# Patient Record
Sex: Male | Born: 1990 | Race: White | Hispanic: No | Marital: Married | Smoking: Never smoker
Health system: Southern US, Community
[De-identification: ages and names within clinical notes are randomized; demographics above are authoritative.]

## PROBLEM LIST (undated history)

## (undated) DIAGNOSIS — K219 Gastro-esophageal reflux disease without esophagitis: Secondary | ICD-10-CM

## (undated) DIAGNOSIS — U071 COVID-19: Secondary | ICD-10-CM

## (undated) DIAGNOSIS — R0602 Shortness of breath: Secondary | ICD-10-CM

## (undated) DIAGNOSIS — M545 Low back pain, unspecified: Secondary | ICD-10-CM

## (undated) HISTORY — DX: COVID-19: U07.1

## (undated) HISTORY — PX: SHOULDER SURGERY: SHX246

## (undated) HISTORY — DX: Low back pain, unspecified: M54.50

## (undated) HISTORY — DX: Shortness of breath: R06.02

## (undated) HISTORY — DX: Gastro-esophageal reflux disease without esophagitis: K21.9

---
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
## 2008-01-22 ENCOUNTER — Ambulatory Visit: Payer: Self-pay | Admitting: Family Medicine

## 2008-01-22 DIAGNOSIS — Q828 Other specified congenital malformations of skin: Secondary | ICD-10-CM

## 2008-01-28 ENCOUNTER — Ambulatory Visit: Payer: Self-pay | Admitting: Family Medicine

## 2008-01-31 ENCOUNTER — Telehealth (INDEPENDENT_AMBULATORY_CARE_PROVIDER_SITE_OTHER): Payer: Self-pay | Admitting: Family Medicine

## 2008-02-09 ENCOUNTER — Ambulatory Visit: Payer: Self-pay | Admitting: Family Medicine

## 2008-02-09 DIAGNOSIS — M25539 Pain in unspecified wrist: Secondary | ICD-10-CM

## 2008-07-23 ENCOUNTER — Encounter (INDEPENDENT_AMBULATORY_CARE_PROVIDER_SITE_OTHER): Payer: Self-pay | Admitting: Family Medicine

## 2008-08-24 ENCOUNTER — Encounter (INDEPENDENT_AMBULATORY_CARE_PROVIDER_SITE_OTHER): Payer: Self-pay | Admitting: Family Medicine

## 2008-08-29 ENCOUNTER — Ambulatory Visit: Payer: Self-pay | Admitting: Family Medicine

## 2008-08-29 DIAGNOSIS — J209 Acute bronchitis, unspecified: Secondary | ICD-10-CM

## 2008-08-29 DIAGNOSIS — T2200XA Burn of unspecified degree of shoulder and upper limb, except wrist and hand, unspecified site, initial encounter: Secondary | ICD-10-CM | POA: Insufficient documentation

## 2008-12-09 ENCOUNTER — Emergency Department (HOSPITAL_COMMUNITY): Admission: EM | Admit: 2008-12-09 | Discharge: 2008-12-09 | Payer: Self-pay | Admitting: Emergency Medicine

## 2009-02-16 ENCOUNTER — Emergency Department (HOSPITAL_COMMUNITY): Admission: EM | Admit: 2009-02-16 | Discharge: 2009-02-17 | Payer: Self-pay | Admitting: Emergency Medicine

## 2009-03-06 ENCOUNTER — Emergency Department (HOSPITAL_COMMUNITY): Admission: EM | Admit: 2009-03-06 | Discharge: 2009-03-06 | Payer: Self-pay | Admitting: Emergency Medicine

## 2010-11-30 IMAGING — CR DG CHEST 2V
2 series · 2 of 2 positions shown · non-contrast
Comparison: None relevant.

CLINICAL DATA: Cough, wheezing and congestion for 2 weeks.

CHEST - 2 VIEW

[view not recorded (1 of 2)]
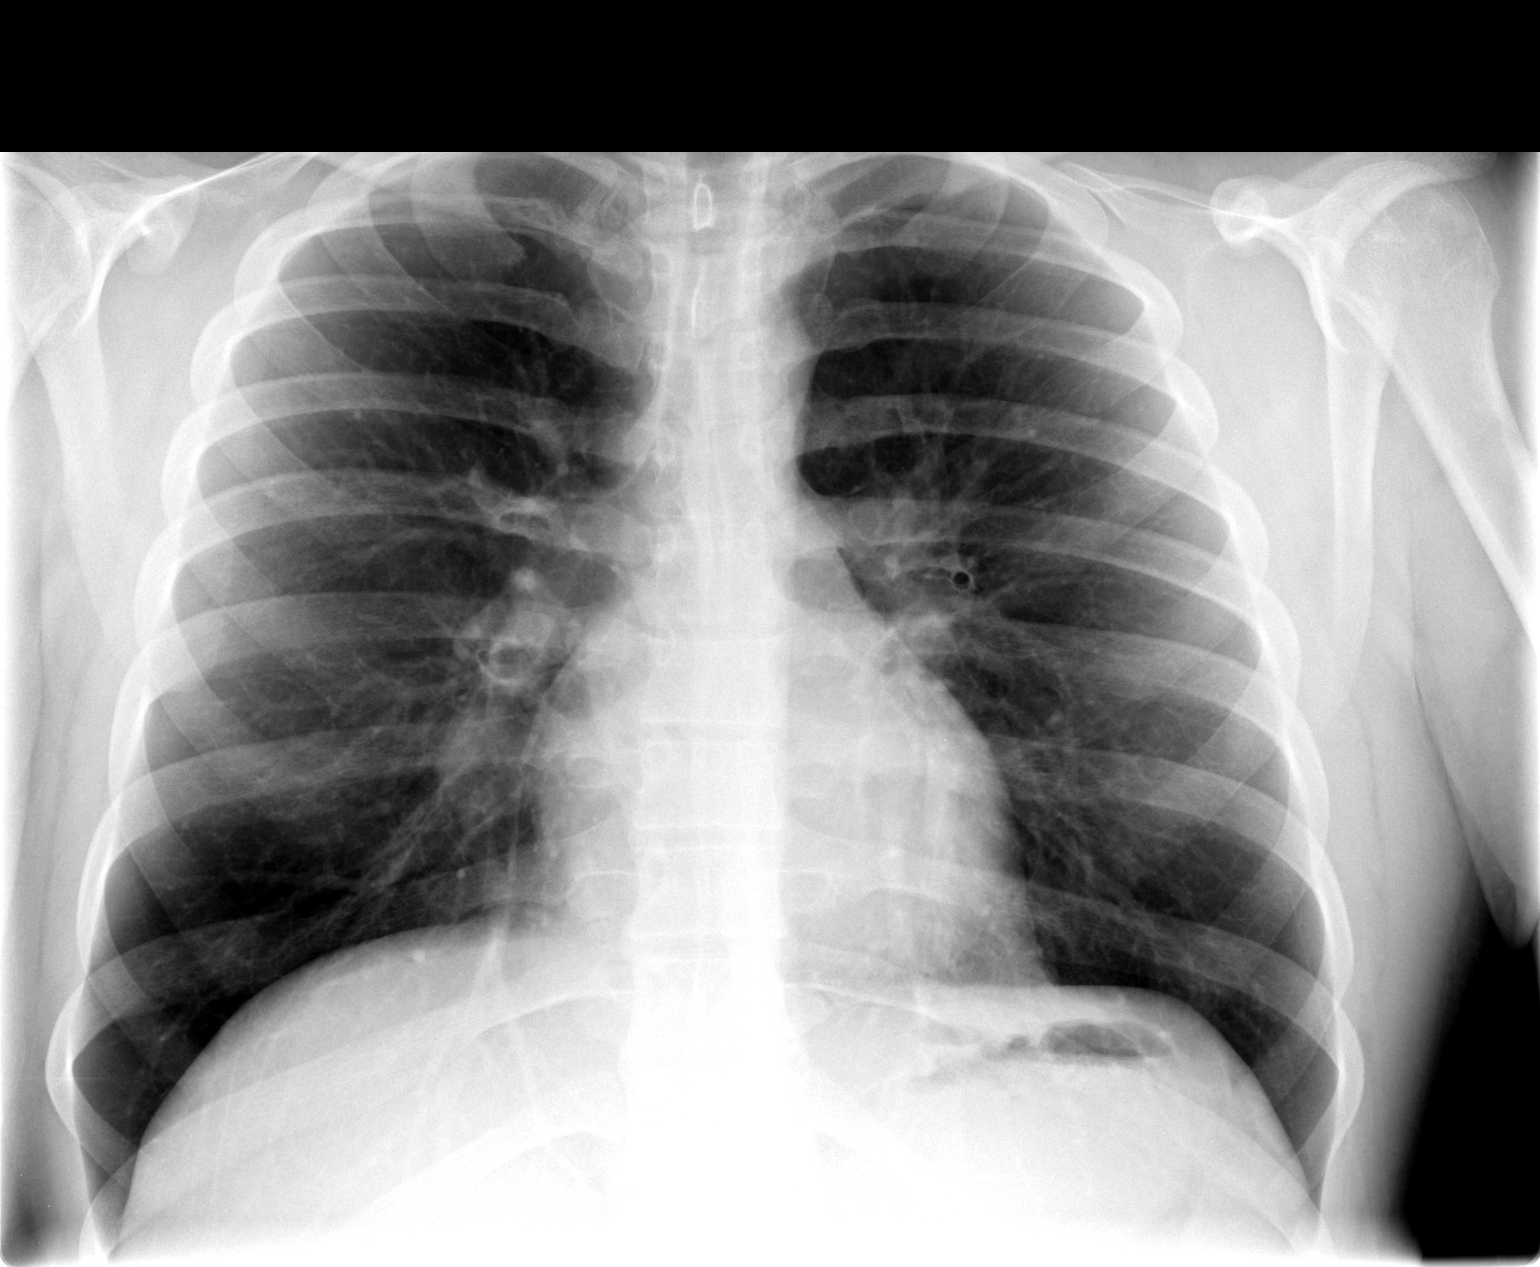

[view not recorded (2 of 2)]
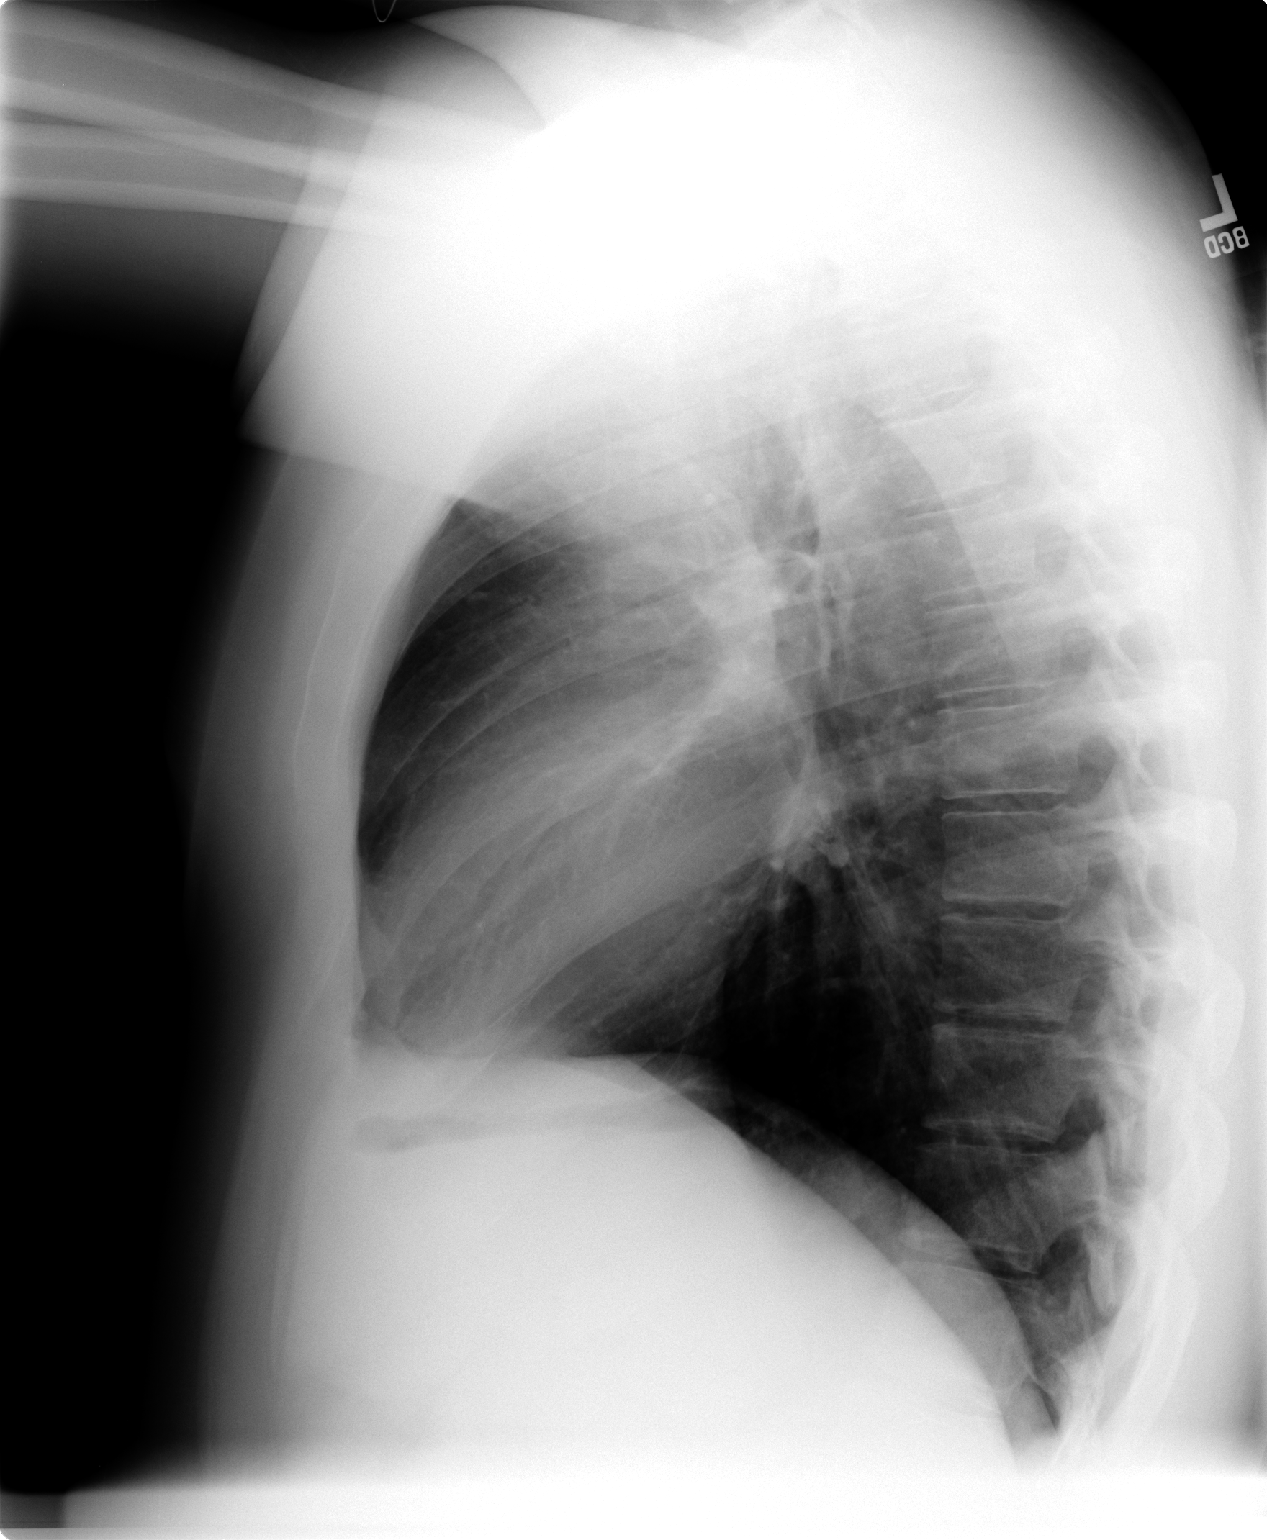

[2 of 2 positions shown; findings below may reference images not displayed]

FINDINGS: The heart size and mediastinal contours are normal.  The
lungs are clear.  There is no pleural effusion or pneumothorax.  No
acute osseous findings are demonstrated.
IMPRESSION: No active cardiopulmonary process.

## 2016-01-22 ENCOUNTER — Encounter (HOSPITAL_COMMUNITY): Payer: Self-pay | Admitting: *Deleted

## 2016-01-22 DIAGNOSIS — F17220 Nicotine dependence, chewing tobacco, uncomplicated: Secondary | ICD-10-CM | POA: Insufficient documentation

## 2016-01-22 DIAGNOSIS — L237 Allergic contact dermatitis due to plants, except food: Secondary | ICD-10-CM | POA: Diagnosis not present

## 2016-01-22 DIAGNOSIS — L299 Pruritus, unspecified: Secondary | ICD-10-CM | POA: Diagnosis present

## 2016-01-22 NOTE — ED Triage Notes (Signed)
Pt c/o swelling and redness to face; [facility] and [facility] given a cortisone injection; pt states he last had benadryl at [id number]

## 2016-01-23 ENCOUNTER — Emergency Department (HOSPITAL_COMMUNITY)
Admission: EM | Admit: 2016-01-23 | Discharge: 2016-01-23 | Disposition: A | Discharge status: Home / Self Care | Payer: PRIVATE HEALTH INSURANCE | Attending: Emergency Medicine | Admitting: Emergency Medicine

## 2016-01-23 DIAGNOSIS — L237 Allergic contact dermatitis due to plants, except food: Secondary | ICD-10-CM

## 2016-01-23 MED ORDER — DEXAMETHASONE SODIUM PHOSPHATE 10 MG/ML IJ SOLN
10.0000 mg | Freq: Once | INTRAMUSCULAR | Status: AC
  Administered 2016-01-23: 10 mg via INTRAVENOUS
  Filled 2016-01-23: qty 1

## 2016-01-23 MED ORDER — DIPHENHYDRAMINE HCL 50 MG/ML IJ SOLN
50.0000 mg | Freq: Once | INTRAMUSCULAR | Status: AC
  Administered 2016-01-23: 50 mg via INTRAVENOUS
  Filled 2016-01-23: qty 1

## 2016-01-23 NOTE — ED Provider Notes (Signed)
[facility]-EMER[facility]ENCY DEPT Provider Note   CSN: [id number] Arrival date & time: 01/22/16  2327     History   Chief Complaint Chief Complaint  Patient presents with  . Allergic Reaction    HPI Alex Burke is a 25 y.o. male.  Patient developed swelling and itching of his face yesterday. He thinks he had contact with poison ivy. He has had similar reactions in the past. Patient reports that his eyes were swollen shut and he had significant swelling around the face with some slight rash on his arms. He was seen at an outside hospital and given a right injection. He was given a prescription for prednisone and told to fill it if his symptoms worsened. His swelling started to worsen again tonight, but pharmacies were closed. He presented for further evaluation. No tongue swelling, throat swelling or difficulty breathing.      History reviewed. No pertinent past medical history.  Patient Active Problem List   Diagnosis Date Noted  . BRONCHITIS, ACUTE 08/29/2008  . BURN, RIGHT ARM 08/29/2008  . WRIST PAIN, RIGHT 02/09/2008  . 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 ANXIETY 05/28/2007  . TOBACCO ABUSE 05/28/2007  . LOW BACK PAIN, CHRONIC 05/28/2007  . INSOMNIA 05/28/2007    Past Surgical History:  Procedure Laterality Date  . SHOULDER SURGERY Left        Home Medications    Prior to Admission medications   Not on File    Family History History reviewed. No pertinent family history.  Social History Social History  Substance Use Topics  . Smoking status: Never Smoker  . Smokeless tobacco: Current User    Types: Chew  . Alcohol use No     Allergies   Patient has no known allergies.   Review of Systems Review of Systems  Skin: Positive for rash.  All other systems reviewed and are negative.    Physical Exam Updated Vital Signs BP 145/79 (BP Location: Left Arm)   Pulse 68   Temp 98.1 F (36.7 C) (Oral)   Resp 20   Ht 5\' 9"  (1.753 m)   Wt 212 lb (96.2 kg)    SpO2 100%   BMI 31.31 kg/m   Physical Exam  Constitutional: He is oriented to person, place, and time. He appears well-developed and well-nourished. No distress.  HENT:  Head: Normocephalic and atraumatic.  Right Ear: Hearing normal.  Left Ear: Hearing normal.  Nose: Nose normal.  Mouth/Throat: Oropharynx is clear and moist and mucous membranes are normal.  Eyes: Conjunctivae and EOM are normal. Pupils are equal, round, and reactive to light.  Neck: Normal range of motion. Neck supple.  Cardiovascular: Regular rhythm, S1 normal and S2 normal.  Exam reveals no gallop and no friction rub.   No murmur heard. Pulmonary/Chest: Effort normal and breath sounds normal. No respiratory distress. He exhibits no tenderness.  Abdominal: Soft. Normal appearance and bowel sounds are normal. There is no hepatosplenomegaly. There is no tenderness. There is no rebound, no guarding, no tenderness at McBurney's point and negative Murphy's sign. No hernia.  Musculoskeletal: Normal range of motion.  Neurological: He is alert and oriented to person, place, and time. He has normal strength. No cranial nerve deficit or sensory deficit. Coordination normal. GCS eye subscore is 4. GCS verbal subscore is 5. GCS motor subscore is 6.  Skin: Skin is warm, dry and intact. No rash noted. No cyanosis.  Erythema with slight yellow crusting and blistering of forehead and face area with periorbital edema  bilaterally  Psychiatric: He has a normal mood and affect. His speech is normal and behavior is normal. Thought content normal.  Nursing note and vitals reviewed.    ED Treatments / Results  Labs (all labs ordered are listed, but only abnormal results are displayed) Labs Reviewed - No data to display  EKG  EKG Interpretation None       Radiology No results found.  Procedures Procedures (including critical care time)  Medications Ordered in ED Medications  dexamethasone (DECADRON) injection 10 mg (not  administered)  diphenhydrAMINE (BENADRYL) injection 50 mg (not administered)     Initial Impression / Assessment and Plan / ED Course  I have reviewed the triage vital signs and the nursing notes.  Pertinent labs & imaging results that were available during my care of the patient were reviewed by me and considered in my medical decision making (see chart for details).  Clinical Course   Agent with rash of the face consistent with contact dermatitis. No sign of superinfection. Administered additional steroids, Benadryl and instructed to continue the prednisone taper that was prescribed at the outside hospital, starting tomorrow morning.  Final Clinical Impressions(s) / ED Diagnoses   Final diagnoses:  Allergic contact dermatitis due to plants, except food    New Prescriptions New Prescriptions   No medications on file     Gilda Creasehristopher J Jerman Tinnon, MD 01/23/16 0202

## 2019-02-04 ENCOUNTER — Other Ambulatory Visit: Payer: Self-pay

## 2019-02-04 DIAGNOSIS — Z20822 Contact with and (suspected) exposure to covid-19: Secondary | ICD-10-CM

## 2019-02-07 LAB — NOVEL CORONAVIRUS, NAA: SARS-CoV-2, NAA: NOT DETECTED

## 2019-04-05 ENCOUNTER — Encounter (INDEPENDENT_AMBULATORY_CARE_PROVIDER_SITE_OTHER): Payer: Self-pay

## 2019-04-14 ENCOUNTER — Encounter (INDEPENDENT_AMBULATORY_CARE_PROVIDER_SITE_OTHER): Payer: Self-pay

## 2019-04-15 ENCOUNTER — Encounter (INDEPENDENT_AMBULATORY_CARE_PROVIDER_SITE_OTHER): Payer: Self-pay | Admitting: Gastroenterology

## 2019-04-15 ENCOUNTER — Other Ambulatory Visit: Payer: Self-pay

## 2019-04-15 ENCOUNTER — Ambulatory Visit (INDEPENDENT_AMBULATORY_CARE_PROVIDER_SITE_OTHER): Payer: BC Managed Care – PPO | Admitting: Gastroenterology

## 2019-04-15 VITALS — BP 142/84 | HR 50 | Temp 97.2°F | Ht 68.0 in | Wt 240.2 lb

## 2019-04-15 DIAGNOSIS — K219 Gastro-esophageal reflux disease without esophagitis: Secondary | ICD-10-CM | POA: Diagnosis not present

## 2019-04-15 DIAGNOSIS — R1032 Left lower quadrant pain: Secondary | ICD-10-CM | POA: Diagnosis not present

## 2019-04-15 MED ORDER — DICYCLOMINE HCL 10 MG PO CAPS: 10.0000 mg | ORAL_CAPSULE | Freq: Three times a day (TID) | ORAL | 3 refills | Status: DC | PRN

## 2019-04-15 NOTE — Progress Notes (Signed)
Patient profile: Alex Burke is a 29 y.o. male seen for evaluation of abd pain.   History of Present Illness: Alex Burke is seen today for for evaluation of abdominal pain, he reports this initially began in November 2020, was having left mid and lower quadrant abdominal pain.  Feels like a pressure sensation typically but can be more of a stabbing and knotted sensation.  He does felt worsens with eating.  Is significantly improved with a bowel movement.  Can occasionally radiate to right abdomen but worse on the left typically.  Previously bowel habits were 4-5 times a day, he stopped chewing tobacco and had a brief period of constipation but now is having 2-3 stools a day that are a 6 on the Bristol stool scale.  He denies any rectal bleeding or melena.  Dicyclomine and align significantly helped symptoms.  With the dicyclomine 3 times daily is having very minimal pain.  Does have some urgency after eating at times with stools.  Has noted greasy foods and pecans makes symptoms worse.  Chronically dairy causes diarrhea and avoids.  He is also had a right-sided testicular pain, ultimately attempted to treat a UTI with sulfa and amoxicillin which ultimately had allergies to both antibiotics.  He saw urologist recommended physical therapy for evaluation but he was unable due to to location.  Also had some GERD symptoms and was seen in the ED for this, EKG was negative.  Started on Nexium 3 weeks ago and these are better.  Denies any dysphagia, nausea, vomiting.  Appetite good.  He is gaining weight.  Wt Readings from Last 3 Encounters:  04/15/19 240 lb 3.2 oz (109 kg)  01/22/16 212 lb (96.2 kg)     Last Colonoscopy: None prior Last Endoscopy: None prior   Past Medical History:  History reviewed. No pertinent past medical history.  Problem List: Patient Active Problem List   Diagnosis Date Noted  . BRONCHITIS, ACUTE 08/29/2008  . BURN, RIGHT ARM 08/29/2008  . WRIST PAIN, RIGHT  02/09/2008  . 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 ANXIETY 05/28/2007  . TOBACCO ABUSE 05/28/2007  . LOW BACK PAIN, CHRONIC 05/28/2007  . INSOMNIA 05/28/2007    Past Surgical History: Past Surgical History:  Procedure Laterality Date  . SHOULDER SURGERY Left     Allergies: Allergies  Allergen Reactions  . Amoxicillin Rash  . Sulfur Rash    Shortness of Breathe      Home Medications:  Current Outpatient Medications:  .  dicyclomine (BENTYL) 10 MG capsule, Take 1 capsule (10 mg total) by mouth 3 (three) times daily as needed (abd pain)., Disp: 90 capsule, Rfl: 3 .  esomeprazole (NEXIUM) 40 MG capsule, Take 40 mg by mouth daily at 12 noon., Disp: , Rfl:  .  Omega-3 Fatty Acids (OMEGA-3 FISH OIL PO), Take 500 mg by mouth daily., Disp: , Rfl:  .  Probiotic Product (ALIGN PO), Take by mouth daily., Disp: , Rfl:    Family History: family history is not on file.    Social History:   reports that he has never smoked. He quit smokeless tobacco use about 8 weeks ago.  His smokeless tobacco use included chew. He reports that he does not drink alcohol or use drugs.   Review of Systems: Constitutional: Denies weight loss/weight gain  Eyes: No changes in vision. ENT: No oral lesions, sore throat.  GI: see HPI.  Heme/Lymph: No easy bruising.  CV: No chest pain.  GU: No hematuria.  Integumentary: No rashes.  Neuro: No headaches.  Psych: No depression/anxiety.  Endocrine: No heat/cold intolerance.  Allergic/Immunologic: No urticaria.  Resp: No cough, SOB.  Musculoskeletal: No joint swelling.    Physical Examination: BP (!) 142/84 (BP Location: Right Arm, Patient Position: Sitting, Cuff Size: Large)   Pulse (!) 50   Temp (!) 97.2 F (36.2 C) (Temporal)   Ht 5\' 8"  (1.727 m)   Wt 240 lb 3.2 oz (109 kg)   BMI 36.52 kg/m  Gen: NAD, alert and oriented x 4 HEENT: PEERLA, EOMI, Neck: supple, no JVD Chest: CTA bilaterally, no wheezes, crackles, or other adventitious sounds CV: RRR,  no m/g/c/r Abd: soft, NT, ND, +BS in all four quadrants; no HSM, guarding, ridigity, or rebound tenderness Ext: no edema, well perfused with 2+ pulses, Skin: no rash or lesions noted on observed skin Lymph: no noted LAD  Data:  03/28/18-hepatitis A IgM negative,Hepatitis B surface antigen core antibody negative, HCV antibody negative,    03/22/18-CBC normal, CMP with ALT 53, AST 26, otherwise normal  02/22/19-CMP normal with ALT 36, AST 17, lipase 20, CBC normal  CT a/p - 03/23/2018- IMPRESSION: No acute findings to explain etiology of patient's pain. 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    Assessment/Plan: Alex Burke is a 29 y.o. male  [name] was seen today for new patient (initial visit).  Diagnoses and all orders for this visit:  Chronic GERD  LLQ pain  Other orders -     dicyclomine (BENTYL) 10 MG capsule; Take 1 capsule (10 mg total) by mouth 3 (three) times daily as needed (abd pain).     1.  Left lower quadrant abdominal pain-has been evaluated on CT x2, unremarkable.  It is significantly improved with dicyclomine and align which I recommend he continue.  Differential includes irritable bowel.  He notes both his mother and father had diverticulitis not seen on CT scan but persistent on colonoscopy and will schedule colonoscopy for evaluation, particularly in setting of father's history as below.  In the interim he will continue dicyclomine, avoiding trigger foods, etc.  Moving stools fairly well at this point without constipation or diarrhea.   2.  History of colon polyps-report dad had colonoscopy at age 30 with almost 83 polyps removed.  Schedule colonoscopy as above.  3.  GERD-well-controlled on daily Nexium.  Continue PPI and diet modifications  Patient denies CP, SOB, and use of blood thinners. I discussed the risks and  benefits of procedure including bleeding, perforation, infection, missed lesions, medication reactions and possible hospitalization or surgery if complications. All questions answered.   Order for colonoscopy to be placed when covid restrictions lifted.   I personally performed the service, non-incident to. (WP)  Laurine Blazer, [facility]

## 2019-04-15 NOTE — Patient Instructions (Signed)
We are scheduling colonoscopy for evaluation-we will contact you when able with Covid restrictions.  Please contact us at anytime with questions.  I am refilling dicyclomine.  If you are going to take Advil, Aleve, etc.,take with food and not on empty stomach  Try to avoid a lot of seeds and nuts if these make symptoms worse

## 2019-04-29 ENCOUNTER — Other Ambulatory Visit (INDEPENDENT_AMBULATORY_CARE_PROVIDER_SITE_OTHER): Payer: Self-pay | Admitting: *Deleted

## 2019-04-29 ENCOUNTER — Telehealth (INDEPENDENT_AMBULATORY_CARE_PROVIDER_SITE_OTHER): Payer: Self-pay | Admitting: *Deleted

## 2019-04-29 ENCOUNTER — Encounter (INDEPENDENT_AMBULATORY_CARE_PROVIDER_SITE_OTHER): Payer: Self-pay | Admitting: *Deleted

## 2019-04-29 DIAGNOSIS — K219 Gastro-esophageal reflux disease without esophagitis: Secondary | ICD-10-CM

## 2019-04-29 DIAGNOSIS — R1032 Left lower quadrant pain: Secondary | ICD-10-CM

## 2019-04-29 DIAGNOSIS — Z8371 Family history of colonic polyps: Secondary | ICD-10-CM

## 2019-04-29 MED ORDER — SUTAB 1479-225-188 MG PO TABS: 1.0000 | ORAL_TABLET | Freq: Once | ORAL | 0 refills | Status: AC

## 2019-04-29 NOTE — Telephone Encounter (Signed)
Patient needs sutab (copay card) TCS sch'd 3/25

## 2019-06-01 ENCOUNTER — Other Ambulatory Visit (HOSPITAL_COMMUNITY): Payer: BC Managed Care – PPO

## 2019-06-08 ENCOUNTER — Other Ambulatory Visit: Payer: Self-pay

## 2019-06-08 ENCOUNTER — Other Ambulatory Visit (HOSPITAL_COMMUNITY)
Admission: RE | Admit: 2019-06-08 | Discharge: 2019-06-08 | Disposition: A | Discharge status: Home / Self Care | Payer: BC Managed Care – PPO | Source: Ambulatory Visit | Attending: Internal Medicine | Admitting: Internal Medicine

## 2019-06-08 DIAGNOSIS — Z20822 Contact with and (suspected) exposure to covid-19: Secondary | ICD-10-CM | POA: Insufficient documentation

## 2019-06-08 DIAGNOSIS — Z01812 Encounter for preprocedural laboratory examination: Secondary | ICD-10-CM | POA: Diagnosis not present

## 2019-06-08 LAB — SARS CORONAVIRUS 2 (TAT 6-24 HRS): SARS Coronavirus 2: NEGATIVE

## 2019-06-10 ENCOUNTER — Other Ambulatory Visit: Payer: Self-pay

## 2019-06-10 ENCOUNTER — Ambulatory Visit (HOSPITAL_COMMUNITY)
Admission: RE | Admit: 2019-06-10 | Discharge: 2019-06-10 | Disposition: A | Discharge status: Home / Self Care | Payer: BC Managed Care – PPO | Attending: Internal Medicine | Admitting: Internal Medicine

## 2019-06-10 ENCOUNTER — Encounter (HOSPITAL_COMMUNITY)
Admission: RE | Disposition: A | Discharge status: Home / Self Care | Payer: Self-pay | Source: Home / Self Care | Attending: Internal Medicine

## 2019-06-10 ENCOUNTER — Encounter (HOSPITAL_COMMUNITY): Payer: Self-pay | Admitting: Internal Medicine

## 2019-06-10 DIAGNOSIS — R1032 Left lower quadrant pain: Secondary | ICD-10-CM | POA: Diagnosis not present

## 2019-06-10 DIAGNOSIS — R197 Diarrhea, unspecified: Secondary | ICD-10-CM | POA: Insufficient documentation

## 2019-06-10 DIAGNOSIS — Z8371 Family history of colonic polyps: Secondary | ICD-10-CM

## 2019-06-10 DIAGNOSIS — K648 Other hemorrhoids: Secondary | ICD-10-CM | POA: Diagnosis not present

## 2019-06-10 HISTORY — PX: COLONOSCOPY: SHX5424

## 2019-06-10 SURGERY — COLONOSCOPY
Anesthesia: Moderate Sedation

## 2019-06-10 MED ORDER — STERILE WATER FOR IRRIGATION IR SOLN
Status: DC | PRN
  Administered 2019-06-10: 1.5 mL

## 2019-06-10 MED ORDER — MEPERIDINE HCL 50 MG/ML IJ SOLN
INTRAMUSCULAR | Status: AC
  Filled 2019-06-10: qty 1

## 2019-06-10 MED ORDER — BENEFIBER DRINK MIX PO PACK: 4.0000 g | PACK | Freq: Every day | ORAL | Status: DC

## 2019-06-10 MED ORDER — MEPERIDINE HCL 50 MG/ML IJ SOLN
INTRAMUSCULAR | Status: DC | PRN
  Administered 2019-06-10 (×3): 25 mg via INTRAVENOUS

## 2019-06-10 MED ORDER — MIDAZOLAM HCL 5 MG/5ML IJ SOLN
INTRAMUSCULAR | Status: DC | PRN
  Administered 2019-06-10: 2 mg via INTRAVENOUS
  Administered 2019-06-10: 3 mg via INTRAVENOUS
  Administered 2019-06-10: 2 mg via INTRAVENOUS

## 2019-06-10 MED ORDER — MIDAZOLAM HCL 5 MG/5ML IJ SOLN
INTRAMUSCULAR | Status: AC
  Filled 2019-06-10: qty 10

## 2019-06-10 MED ORDER — SODIUM CHLORIDE 0.9 % IV SOLN: INTRAVENOUS | Status: DC

## 2019-06-10 NOTE — Discharge Instructions (Signed)
Resume usual medications as before. Benefiber or Metamucil 4 g by mouth daily at bedtime. High-fiber diet. No driving for 24 hours. Office visit in 6 months.   Colonoscopy, Adult, Care After This sheet gives you information about how to care for yourself after your procedure. Your doctor may also give you more specific instructions. If you have problems or questions, call your doctor. What can I expect after the procedure? After the procedure, it is common to have:  A small amount of blood in your poop (stool) for 24 hours.  Some gas.  Mild cramping or bloating in your belly (abdomen). Follow these instructions at home: Eating and drinking   Drink enough fluid to keep your pee (urine) pale yellow.  Follow instructions from your doctor about what you cannot eat or drink.  Return to your normal diet as told by your doctor. Avoid heavy or fried foods that are hard to digest. Activity  Rest as told by your doctor.  Do not sit for a long time without moving. Get up to take short walks every 1-2 hours. This is important. Ask for help if you feel weak or unsteady.  Return to your normal activities as told by your doctor. Ask your doctor what activities are safe for you. To help cramping and bloating:   Try walking around.  Put heat on your belly as told by your doctor. Use the heat source that your doctor recommends, such as a moist heat pack or a heating pad. ? Put a towel between your skin and the heat source. ? Leave the heat on for 20-30 minutes. ? Remove the heat if your skin turns bright red. This is very important if you are unable to feel pain, heat, or cold. You may have a greater risk of getting burned. General instructions  For the first 24 hours after the procedure: ? Do not drive or use machinery. ? Do not sign important documents. ? Do not drink alcohol. ? Do your daily activities more slowly than normal. ? Eat foods that are soft and easy to digest.  Take  over-the-counter or prescription medicines only as told by your doctor.  Keep all follow-up visits as told by your doctor. This is important. Contact a doctor if:  You have blood in your poop 2-3 days after the procedure. Get help right away if:  You have more than a small amount of blood in your poop.  You see large clumps of tissue (blood clots) in your poop.  Your belly is swollen.  You feel like you may vomit (nauseous).  You vomit.  You have a fever.  You have belly pain that gets worse, and medicine does not help your pain. Summary  After the procedure, it is common to have a small amount of blood in your poop. You may also have mild cramping and bloating in your belly.  For the first 24 hours after the procedure, do not drive or use machinery, do not sign important documents, and do not drink alcohol.  Get help right away if you have a lot of blood in your poop, feel like you may vomit, have a fever, or have more belly pain. This information is not intended to replace advice given to you by your health care provider. Make sure you discuss any questions you have with your health care provider. Document Revised: 09/28/2018 Document Reviewed: 09/28/2018 Elsevier Patient Education  2020 Elsevier Inc.  High-Fiber Diet Fiber, also called dietary fiber, is a type  of carbohydrate that is found in fruits, vegetables, whole grains, and beans. A high-fiber diet can have many health benefits. Your health care provider may recommend a high-fiber diet to help:  Prevent constipation. Fiber can make your bowel movements more regular.  Lower your cholesterol.  Relieve the following conditions: ? Swelling of veins in the anus (hemorrhoids). ? Swelling and irritation (inflammation) of specific areas of the digestive tract (uncomplicated diverticulosis). ? A problem of the large intestine (colon) that sometimes causes pain and diarrhea (irritable bowel syndrome, IBS).  Prevent  overeating as part of a weight-loss plan.  Prevent heart disease, type 2 diabetes, and certain cancers. What is my plan? The recommended daily fiber intake in grams (g) includes:  38 g for men age 62 or younger.  30 g for men over age 3.  41 g for women age 37 or younger.  21 g for women over age 69. You can get the recommended daily intake of dietary fiber by:  Eating a variety of fruits, vegetables, grains, and beans.  Taking a fiber supplement, if it is not possible to get enough fiber through your diet. What do I need to know about a high-fiber diet?  It is better to get fiber through food sources rather than from fiber supplements. There is not a lot of research about how effective supplements are.  Always check the fiber content on the nutrition facts label of any prepackaged food. Look for foods that contain 5 g of fiber or more per serving.  Talk with a diet and nutrition specialist (dietitian) if you have questions about specific foods that are recommended or not recommended for your medical condition, especially if those foods are not listed below.  Gradually increase how much fiber you consume. If you increase your intake of dietary fiber too quickly, you may have bloating, cramping, or gas.  Drink plenty of water. Water helps you to digest fiber. What are tips for following this plan?  Eat a wide variety of high-fiber foods.  Make sure that half of the grains that you eat each day are whole grains.  Eat breads and cereals that are made with whole-grain flour instead of refined flour or white flour.  Eat brown rice, bulgur wheat, or millet instead of white rice.  Start the day with a breakfast that is high in fiber, such as a cereal that contains 5 g of fiber or more per serving.  Use beans in place of meat in soups, salads, and pasta dishes.  Eat high-fiber snacks, such as berries, raw vegetables, nuts, and popcorn.  Choose whole fruits and vegetables instead  of processed forms like juice or sauce. What foods can I eat?  Fruits Berries. Pears. Apples. Oranges. Avocado. Prunes and raisins. Dried figs. Vegetables [place] potatoes. Spinach. Kale. Artichokes. Cabbage. Broccoli. Cauliflower. Green peas. Carrots. Squash. Grains Whole-grain breads. Multigrain cereal. Oats and oatmeal. Brown rice. Barley. Bulgur wheat. [place]. Quinoa. Bran muffins. Popcorn. Rye wafer crackers. Meats and other proteins Navy, kidney, and pinto beans. Soybeans. Split peas. Lentils. Nuts and seeds. Dairy Fiber-fortified yogurt. Beverages Fiber-fortified soy milk. Fiber-fortified orange juice. Other foods Fiber bars. The items listed above may not be a complete list of recommended foods and beverages. Contact a dietitian for more options. What foods are not recommended? Fruits Fruit juice. Cooked, strained fruit. Vegetables Fried potatoes. Canned vegetables. Well-cooked vegetables. Grains White bread. Pasta made with refined flour. White rice. Meats and other proteins Fatty cuts of meat. Fried chicken or fried  fish. Dairy Milk. Yogurt. Cream cheese. Sour cream. Fats and oils Butters. Beverages Soft drinks. Other foods Cakes and pastries. The items listed above may not be a complete list of foods and beverages to avoid. Contact a dietitian for more information. Summary  Fiber is a type of carbohydrate. It is found in fruits, vegetables, whole grains, and beans.  There are many health benefits of eating a high-fiber diet, such as preventing constipation, lowering blood cholesterol, helping with weight loss, and reducing your risk of heart disease, diabetes, and certain cancers.  Gradually increase your intake of fiber. Increasing too fast can result in cramping, bloating, and gas. Drink plenty of water while you increase your fiber.  The best sources of fiber include whole fruits and vegetables, whole grains, nuts, seeds, and beans. This information is not  intended to replace advice given to you by your health care provider. Make sure you discuss any questions you have with your health care provider. Document Revised: 01/06/2017 Document Reviewed: 01/06/2017 Elsevier Patient Education  2020 [employer].  Hemorrhoids Hemorrhoids are swollen veins that may develop:  In the butt (rectum). These are called internal hemorrhoids.  Around the opening of the butt (anus). These are called external hemorrhoids. Hemorrhoids can cause pain, itching, or bleeding. Most of the time, they do not cause serious problems. They usually get better with diet changes, lifestyle changes, and other home treatments. What are the causes? This condition may be caused by:  Having trouble pooping (constipation).  Pushing hard (straining) to poop.  Watery poop (diarrhea).  Pregnancy.  Being very overweight (obese).  Sitting for long periods of time.  Heavy lifting or other activity that causes you to strain.  Anal sex.  Riding a bike for a long period of time. What are the signs or symptoms? Symptoms of this condition include:  Pain.  Itching or soreness in the butt.  Bleeding from the butt.  Leaking poop.  Swelling in the area.  One or more lumps around the opening of your butt. How is this diagnosed? A doctor can often diagnose this condition by looking at the affected area. The doctor may also:  Do an exam that involves feeling the area with a gloved hand (digital rectal exam).  Examine the area inside your butt using a small tube (anoscope).  Order blood tests. This may be done if you have lost a lot of blood.  Have you get a test that involves looking inside the colon using a flexible tube with a camera on the end (sigmoidoscopy or colonoscopy). How is this treated? This condition can usually be treated at home. Your doctor may tell you to change what you eat, make lifestyle changes, or try home treatments. If these do not help,  procedures can be done to remove the hemorrhoids or make them smaller. These may involve:  Placing rubber bands at the base of the hemorrhoids to cut off their blood supply.  Injecting medicine into the hemorrhoids to shrink them.  Shining a type of light energy onto the hemorrhoids to cause them to fall off.  Doing surgery to remove the hemorrhoids or cut off their blood supply. Follow these instructions at home: Eating and drinking   Eat foods that have a lot of fiber in them. These include whole grains, beans, nuts, fruits, and vegetables.  Ask your doctor about taking products that have added fiber (fibersupplements).  Reduce the amount of fat in your diet. You can do this by: ? Eating low-fat  dairy products. ? Eating less red meat. ? Avoiding processed foods.  Drink enough fluid to keep your pee (urine) pale yellow. Managing pain and swelling   Take a warm-water bath (sitz bath) for 20 minutes to ease pain. Do this 3-4 times a day. You may do this in a bathtub or using a portable sitz bath that fits over the toilet.  If told, put ice on the painful area. It may be helpful to use ice between your warm baths. ? Put ice in a plastic bag. ? Place a towel between your skin and the bag. ? Leave the ice on for 20 minutes, 2-3 times a day. General instructions  Take over-the-counter and prescription medicines only as told by your doctor. ? Medicated creams and medicines may be used as told.  Exercise often. Ask your doctor how much and what kind of exercise is best for you.  Go to the bathroom when you have the urge to poop. Do not wait.  Avoid pushing too hard when you poop.  Keep your butt dry and clean. Use wet toilet paper or moist towelettes after pooping.  Do not sit on the toilet for a long time.  Keep all follow-up visits as told by your doctor. This is important. Contact a doctor if you:  Have pain and swelling that do not get better with treatment or  medicine.  Have trouble pooping.  Cannot poop.  Have pain or swelling outside the area of the hemorrhoids. Get help right away if you have:  Bleeding that will not stop. Summary  Hemorrhoids are swollen veins in the butt or around the opening of the butt.  They can cause pain, itching, or bleeding.  Eat foods that have a lot of fiber in them. These include whole grains, beans, nuts, fruits, and vegetables.  Take a warm-water bath (sitz bath) for 20 minutes to ease pain. Do this 3-4 times a day. This information is not intended to replace advice given to you by your health care provider. Make sure you discuss any questions you have with your health care provider. Document Revised: 03/12/2018 Document Reviewed: 07/24/2017 Elsevier Patient Education  2020 [employer].

## 2019-06-10 NOTE — Op Note (Signed)
[facility] Patient Name: Alex Burke Procedure Date: 06/10/2019 9:07 AM MRN: [id number] Date of Birth: 02/28/1991 Attending MD: Hildred Laser , MD CSN: [id number] Age: 29 Admit Type: Outpatient Procedure:                Colonoscopy Indications:              Abdominal pain in the left lower quadrant, Diarrhea Providers:                Hildred Laser, MD, Charlsie Quest. Theda Sers RN, RN, Aram Candela Referring MD:             Domingo Pulse. Frederico Hamman, [facility] Medicines:                Meperidine 75 mg IV, Midazolam 7 mg IV Complications:            No immediate complications. Estimated Blood Loss:     Estimated blood loss: none. Procedure:                Pre-Anesthesia Assessment:                           - Prior to the procedure, a History and Physical                            was performed, and patient medications and                            allergies were reviewed. The patient's tolerance of                            previous anesthesia was also reviewed. The risks                            and benefits of the procedure and the sedation                            options and risks were discussed with the patient.                            All questions were answered, and informed consent                            was obtained. Prior Anticoagulants: The patient has                            taken no previous anticoagulant or antiplatelet                            agents. ASA Grade Assessment: I - A normal, healthy                            patient. After reviewing the risks and benefits,  the patient was deemed in satisfactory condition to                            undergo the procedure.                           After obtaining informed consent, the colonoscope                            was passed under direct vision. Throughout the                            procedure, the patient's blood pressure, pulse, and             oxygen saturations were monitored continuously. The                            [id number]-[id number] ([id number]) was introduced through the                            anus and advanced to the the terminal ileum, with                            identification of the appendiceal orifice and IC                            valve. The colonoscopy was performed without                            difficulty. The patient tolerated the procedure                            well. The quality of the bowel preparation was                            adequate and good. The terminal ileum, ileocecal                            valve, appendiceal orifice, and rectum were                            photographed. Scope In: 9:46:08 AM Scope Out: 9:57:54 AM Scope Withdrawal Time: 0 hours 8 minutes 8 seconds  Total Procedure Duration: 0 hours 11 minutes 46 seconds  Findings:      The perianal and digital rectal examinations were normal.      The terminal ileum appeared normal.      The colon (entire examined portion) appeared normal.      Internal hemorrhoids were found during retroflexion. The hemorrhoids       were small. Impression:               - The examined portion of the ileum was normal.                           - The entire examined colon is normal.                           -  Internal hemorrhoids.                           - No specimens collected. Moderate Sedation:      Moderate (conscious) sedation was administered by the endoscopy nurse       and supervised by the endoscopist. The following parameters were       monitored: oxygen saturation, heart rate, blood pressure, CO2       capnography and response to care. Total physician intraservice time was       18 minutes. Recommendation:           - Patient has a contact number available for                            emergencies. The signs and symptoms of potential                            delayed complications were discussed with the                             patient. Return to normal activities tomorrow.                            Written discharge instructions were provided to the                            patient.                           - High fiber diet today.                           - Benefiber 4 g po qhs.                           - Continue present medications.                           - Repeat colonoscopy at age 41 for screening                            purposes unless family history changes. Procedure Code(s):        --- Professional ---                           [phone], Colonoscopy, flexible; diagnostic, including                            collection of specimen(s) by brushing or washing,                            when performed (separate procedure)                           G0500, Moderate sedation services provided by the  same physician or other qualified health care                            professional performing a gastrointestinal                            endoscopic service that sedation supports,                            requiring the presence of an independent trained                            observer to assist in the monitoring of the                            patient's level of consciousness and physiological                            status; initial 15 minutes of intra-service time;                            patient age 1 years or older (additional time may                            be reported with 33354, as appropriate) Diagnosis Code(s):        --- Professional ---                           K64.8, Other hemorrhoids                           R10.32, Left lower quadrant pain                           R19.7, Diarrhea, unspecified CPT copyright 2019 American Medical Association. All rights reserved. The codes documented in this report are preliminary and upon coder review may  be revised to meet current compliance requirements. Lionel December, MD Lionel December,  MD 06/10/2019 10:08:38 AM This report has been signed electronically. Number of Addenda: 0

## 2019-06-10 NOTE — H&P (Signed)
Alex Burke is an 29 y.o. male.   Chief Complaint: Patient is here for colonoscopy. HPI: Patient is 29 year old Caucasian male with 1 year history of intermittent left-sided abdominal pain and diarrhea.  He is doing better with dicyclomine which he is using on as-needed basis.  He also noted change in caliber of his stools.  No history of melena or rectal bleeding or weight loss. Family history significant for colonic polyps in his father who had 27 polyps removed when he was in his 30s.  He has not had any more polyps thereafter. Family history is negative for CRC.  History reviewed. No pertinent past medical history.  Past Surgical History:  Procedure Laterality Date  . SHOULDER SURGERY Left     History reviewed. No pertinent family history. Social History:  reports that he has never smoked. He quit smokeless tobacco use about 3 months ago.  His smokeless tobacco use included chew. He reports that he does not drink alcohol or use drugs.  Allergies:  Allergies  Allergen Reactions  . Amoxicillin Rash    Did it involve swelling of the face/tongue/throat, SOB, or low BP? No Did it involve sudden or severe rash/hives, skin peeling, or any reaction on the inside of your mouth or nose? No Did you need to seek medical attention at a hospital or doctor's office? No When did it last happen?Jan 2021 If all above answers are "NO", may proceed with cephalosporin use.   . Sulfur Shortness Of Breath and Rash  . Allegra [Fexofenadine] Nausea Only    Medications Prior to Admission  Medication Sig Dispense Refill  . acetaminophen (TYLENOL) 500 MG tablet Take 500 mg by mouth every 8 (eight) hours as needed for moderate pain or headache.    . dicyclomine (BENTYL) 10 MG capsule Take 1 capsule (10 mg total) by mouth 3 (three) times daily as needed (abd pain). 90 capsule 3  . esomeprazole (NEXIUM) 40 MG capsule Take 40 mg by mouth daily.     . Homeopathic Products (TECNU EXTREME MED POISON  IVY EX) Apply 1 application topically daily as needed (poison ivy).    . Omega-3 1000 MG CAPS Take 1,000 mg by mouth daily.    . Probiotic Product (ALIGN PO) Take 1 capsule by mouth daily.       No results found for this or any previous visit (from the past 48 hour(s)). No results found.  Review of Systems  Blood pressure (!) 147/86, pulse 96, temperature 99.2 F (37.3 C), temperature source Oral, resp. rate 16, height 5\' 8"  (1.727 m), SpO2 100 %. Physical Exam  Constitutional: He appears well-developed and well-nourished.  HENT:  Mouth/Throat: Oropharynx is clear and moist.  Eyes: Conjunctivae are normal. No scleral icterus.  Neck: No thyromegaly present.  Cardiovascular: Normal rate, regular rhythm and normal heart sounds.  No murmur heard. Respiratory: Effort normal and breath sounds normal.  GI:  Abdomen is full.  Is soft and nontender with organomegaly or masses.  Musculoskeletal:        General: No edema.  Neurological: He is alert.  Skin: Skin is warm and dry.     Assessment/Plan Left-sided abdominal pain and diarrhea. History of multiple polyps in his father at young age. Diagnostic colonoscopy.  , MD 06/10/2019, 9:35 AM

## 2019-12-14 ENCOUNTER — Ambulatory Visit (INDEPENDENT_AMBULATORY_CARE_PROVIDER_SITE_OTHER): Payer: BC Managed Care – PPO | Admitting: Internal Medicine

## 2020-01-17 ENCOUNTER — Telehealth (INDEPENDENT_AMBULATORY_CARE_PROVIDER_SITE_OTHER): Payer: Self-pay

## 2020-01-17 ENCOUNTER — Telehealth (INDEPENDENT_AMBULATORY_CARE_PROVIDER_SITE_OTHER): Payer: Self-pay | Admitting: *Deleted

## 2020-01-17 ENCOUNTER — Other Ambulatory Visit (INDEPENDENT_AMBULATORY_CARE_PROVIDER_SITE_OTHER): Payer: Self-pay

## 2020-01-17 ENCOUNTER — Ambulatory Visit (INDEPENDENT_AMBULATORY_CARE_PROVIDER_SITE_OTHER): Payer: BC Managed Care – PPO | Admitting: Gastroenterology
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
## 2020-01-18 ENCOUNTER — Other Ambulatory Visit (INDEPENDENT_AMBULATORY_CARE_PROVIDER_SITE_OTHER): Payer: Self-pay

## 2020-01-18 MED ORDER — ESOMEPRAZOLE MAGNESIUM 40 MG PO CPDR: 40.0000 mg | DELAYED_RELEASE_CAPSULE | Freq: Every day | ORAL | 5 refills | Status: DC

## 2020-01-18 NOTE — Telephone Encounter (Signed)
LeighAnn Tess Potts, CMA  

## 2020-01-27 ENCOUNTER — Other Ambulatory Visit (INDEPENDENT_AMBULATORY_CARE_PROVIDER_SITE_OTHER): Payer: Self-pay | Admitting: Gastroenterology

## 2020-01-27 ENCOUNTER — Encounter (INDEPENDENT_AMBULATORY_CARE_PROVIDER_SITE_OTHER): Payer: Self-pay | Admitting: Gastroenterology

## 2020-01-27 ENCOUNTER — Ambulatory Visit (INDEPENDENT_AMBULATORY_CARE_PROVIDER_SITE_OTHER): Payer: BC Managed Care – PPO | Admitting: Gastroenterology

## 2020-01-27 ENCOUNTER — Other Ambulatory Visit: Payer: Self-pay

## 2020-01-27 ENCOUNTER — Other Ambulatory Visit (INDEPENDENT_AMBULATORY_CARE_PROVIDER_SITE_OTHER): Payer: Self-pay | Admitting: *Deleted

## 2020-01-27 DIAGNOSIS — K219 Gastro-esophageal reflux disease without esophagitis: Secondary | ICD-10-CM | POA: Diagnosis not present

## 2020-01-27 DIAGNOSIS — K581 Irritable bowel syndrome with constipation: Secondary | ICD-10-CM | POA: Diagnosis not present

## 2020-01-27 DIAGNOSIS — K7581 Nonalcoholic steatohepatitis (NASH): Secondary | ICD-10-CM | POA: Insufficient documentation

## 2020-01-27 DIAGNOSIS — K589 Irritable bowel syndrome without diarrhea: Secondary | ICD-10-CM | POA: Insufficient documentation

## 2020-01-27 DIAGNOSIS — R7989 Other specified abnormal findings of blood chemistry: Secondary | ICD-10-CM | POA: Diagnosis not present

## 2020-01-27 NOTE — Patient Instructions (Addendum)
Continue Nexium 40 mg qday Perform blood workup Explained to the patient the etiology and consequences of his current liver disease. Patient was counseled about the benefit of implementing a Mediterranean diet and exercise plan to decrease at least 7.5% of weight (18 lb) in 6 months. The patient understood about the importance of lifestyle changes to potentially reverse his liver involvement.

## 2020-01-27 NOTE — Progress Notes (Signed)
Alex Burke, M.D. Gastroenterology & Hepatology [facility] [address] [place], [place] [address]  Primary Care Physician: Alex Burke [address] [place] [address]  I will communicate my assessment and recommendations to the referring MD via EMR. "Note: Occasional unusual wording and randomly placed punctuation marks may result from the use of speech recognition technology to transcribe this document"  Problems: 1. NASH 2. Elevated LFTs 3. IBS-C 4. GERD  History of Present Illness: Alex Burke is a 29 y.o. male with PMH NASH, IBS-C, GERD, who presents for follow up of NASH.  The patient was last seen on 04/15/2019. At that time, the patient was scheduled for a colonoscopy which was unremarkable besides of the presence of hemorrhoids.  He was asked to continue taking Nexium daily for his GERD.  States that his constipation got worse when he stopped chewing tobacco, but he reports his stools have improved recently on its own and he is not taking laxative.  He is having 4 bowel movement every day.  Denies having any significant abdominal pain, although he states he had some episodes of retrosternal chest pains in August 2021.  States that when the symptoms started he was diagnosed with COVID at that time.  Patient was taking esomeprazole 40 mg qday for some time but stopped on his own but given the fact he had recurrent chest pain he restarted Nexium daily. He states he only had one episode since then, especially when he leans on his left side ior after going to sleep just after eating.  However, the heartburn has completely resolved after he has taken Nexium.  Patient states he is concerned about his fatty liver and weight gain. Has changed his diet lifestyle for last 2 couple weeks. States that he has lost close to 6 pounds since he made the changes and he would like to know what kind of dietary changes he  needs to implement.  The patient denies having any nausea, vomiting, fever, chills, hematochezia, melena, hematemesis, abdominal distention,  diarrhea, jaundice, pruritus.  Last Colonoscopy: 06/10/2019 - INTERNAL HEMORRHOIDS, NORMAL TI AND COLON. Advised to take Benefiber.  Past Medical History:History reviewed. No pertinent past medical history.  Past Surgical History: Past Surgical History:  Procedure Laterality Date  . COLONOSCOPY N/A 06/10/2019   Procedure: COLONOSCOPY;  Surgeon: Malissa Hippo, MD;  [facility];  Service: Endoscopy;  Laterality: N/A;  955  . SHOULDER SURGERY Left     Family History:History reviewed. No pertinent family history.  Social History: Social History   Tobacco Use  Smoking Status Never Smoker  Smokeless Tobacco Former Neurosurgeon  . Types: Chew   Social History   Substance and Sexual Activity  Alcohol Use No   Social History   Substance and Sexual Activity  Drug Use No    Allergies: Allergies  Allergen Reactions  . Amoxicillin Rash    Did it involve swelling of the face/tongue/throat, SOB, or low BP? No Did it involve sudden or severe rash/hives, skin peeling, or any reaction on the inside of your mouth or nose? No Did you need to seek medical attention at a hospital or doctor's office? No When did it last happen?Jan 2021 If all above answers are "NO", may proceed with cephalosporin use.   . Sulfur Shortness Of Breath and Rash  . Allegra [Fexofenadine] Nausea Only    Medications: Current Outpatient Medications  Medication Sig Dispense Refill  . acetaminophen (TYLENOL) 500  MG tablet Take 500 mg by mouth every 8 (eight) hours as needed for moderate pain or headache.    . dicyclomine (BENTYL) 10 MG capsule Take 1 capsule (10 mg total) by mouth 3 (three) times daily as needed (abd pain). 90 capsule 3  . esomeprazole (NEXIUM) 40 MG capsule Take 1 capsule (40 mg total) by mouth daily. 30 capsule 5  . Homeopathic Products  (TECNU EXTREME MED POISON IVY EX) Apply 1 application topically daily as needed (poison ivy).    . Omega-3 1000 MG CAPS Take 1,000 mg by mouth daily.    . Probiotic Product (ALIGN PO) Take 1 capsule by mouth daily.     . Wheat Dextrin (BENEFIBER DRINK MIX) PACK Take 4 g by mouth at bedtime.     No current facility-administered medications for this visit.    Review of Systems: GENERAL: negative for malaise, night sweats HEENT: No changes in hearing or vision, no nose bleeds or other nasal problems. NECK: Negative for lumps, goiter, pain and significant neck swelling RESPIRATORY: Negative for cough, wheezing CARDIOVASCULAR: Negative for chest pain, leg swelling, palpitations, orthopnea GI: SEE HPI MUSCULOSKELETAL: Negative for joint pain or swelling, back pain, and muscle pain. SKIN: Negative for lesions, rash PSYCH: Negative for sleep disturbance, mood disorder and recent psychosocial stressors. HEMATOLOGY Negative for prolonged bleeding, bruising easily, and swollen nodes. ENDOCRINE: Negative for cold or heat intolerance, polyuria, polydipsia and goiter. NEURO: negative for tremor, gait imbalance, syncope and seizures. The remainder of the review of systems is noncontributory.   Physical Exam: BP 135/85 (BP Location: Right Arm, Patient Position: Sitting, Cuff Size: Large)   Pulse 65   Temp 98.2 F (36.8 C) (Oral)   Ht 5\' 8"  (1.727 m)   Wt 244 lb 11.2 oz (111 kg)   BMI 37.21 kg/m  GENERAL: The patient is AO x3, in no acute distress. Obese. HEENT: Head is normocephalic and atraumatic. EOMI are intact. Mouth is well hydrated and without lesions. NECK: Supple. No masses LUNGS: Clear to auscultation. No presence of rhonchi/wheezing/rales. Adequate chest expansion HEART: RRR, normal s1 and s2. ABDOMEN: Soft, nontender, no guarding, no peritoneal signs, and nondistended. BS +. No masses. EXTREMITIES: Without any cyanosis, clubbing, rash, lesions or edema. NEUROLOGIC: AOx3, no focal  motor deficit. SKIN: no jaundice, no rashes  Imaging/Labs: as above  I personally reviewed and interpreted the available labs, imaging and endoscopic files.  Impression and Plan: Alex Burke is a 28 y.o. male with PMH NASH, IBS-C, GERD, who presents for follow up of NASH.  Regarding his NASH, I advised him that it would be important for him to lose at least 7.5% of his weight to achieve significant progression in the severity of his steatosis.  His aminotransferase elevation is very mild and likely can improve after these changes are implemented.  He can abdominal Mediterranean diet.  His NAFLD score is -2.50, no further imaging is warranted to investigate advanced fibrosis.  We will also check for other reversible causes of elevated liver enzymes but these etiologies seem to be less likely.  In terms of his GERD, he has had improvement of his symptoms while taking Nexium, which he needs to continue indefinitely.  Certainly weight loss will help decreasing the frequency of the symptoms.  - Continue Nexium 40 mg qday - Check CMP, CBC, acute hepatitis, iron panel, ANA, AMA, ASMA, IgG, TTG IgA,  - Explained to the patient the etiology and consequences of his current liver disease. Patient was counseled about  the benefit of implementing a Mediterranean diet and exercise plan to decrease at least 7.5% of weight (18 lb) in 6 months. The patient understood about the importance of lifestyle changes to potentially reverse his liver involvement.  All questions were answered.      Dolores Frame, MD Gastroenterology and Hepatology [facility]

## 2020-01-30 LAB — COMPREHENSIVE METABOLIC PANEL
AG Ratio: 2 (calc) (ref 1.0–2.5)
ALT: 41 U/L (ref 9–46)
AST: 21 U/L (ref 10–40)
Albumin: 4.7 g/dL (ref 3.6–5.1)
Alkaline phosphatase (APISO): 58 U/L (ref 36–130)
BUN: 16 mg/dL (ref 7–25)
CO2: 29 mmol/L (ref 20–32)
Calcium: 9.8 mg/dL (ref 8.6–10.3)
Chloride: 104 mmol/L (ref 98–110)
Creat: 0.72 mg/dL (ref 0.60–1.35)
Globulin: 2.3 g/dL (calc) (ref 1.9–3.7)
Glucose, Bld: 87 mg/dL (ref 65–99)
Potassium: 4.4 mmol/L (ref 3.5–5.3)
Sodium: 141 mmol/L (ref 135–146)
Total Bilirubin: 0.4 mg/dL (ref 0.2–1.2)
Total Protein: 7 g/dL (ref 6.1–8.1)

## 2020-01-30 LAB — CBC WITH DIFFERENTIAL/PLATELET
Absolute Monocytes: 281 cells/uL (ref 200–950)
Basophils Absolute: 31 cells/uL (ref 0–200)
Basophils Relative: 0.6 %
Eosinophils Absolute: 41 cells/uL (ref 15–500)
Eosinophils Relative: 0.8 %
HCT: 45.7 % (ref 38.5–50.0)
Hemoglobin: 16.1 g/dL (ref 13.2–17.1)
Lymphs Abs: 2071 cells/uL (ref 850–3900)
MCH: 30.8 pg (ref 27.0–33.0)
MCHC: 35.2 g/dL (ref 32.0–36.0)
MCV: 87.5 fL (ref 80.0–100.0)
MPV: 8.1 fL (ref 7.5–12.5)
Monocytes Relative: 5.5 %
Neutro Abs: 2678 cells/uL (ref 1500–7800)
Neutrophils Relative %: 52.5 %
Platelets: 367 10*3/uL (ref 140–400)
RBC: 5.22 10*6/uL (ref 4.20–5.80)
RDW: 12.6 % (ref 11.0–15.0)
Total Lymphocyte: 40.6 %
WBC: 5.1 10*3/uL (ref 3.8–10.8)

## 2020-01-30 LAB — HEPATITIS PANEL, ACUTE
Hep A IgM: NONREACTIVE
Hep B C IgM: NONREACTIVE
Hepatitis B Surface Ag: NONREACTIVE
Hepatitis C Ab: NONREACTIVE
SIGNAL TO CUT-OFF: 0.01 (ref <1.00)

## 2020-01-30 LAB — ANTI-SMOOTH MUSCLE ANTIBODY, IGG: Actin (Smooth Muscle) Antibody (IGG): <20 U (ref <20)

## 2020-01-30 LAB — ANA: Anti Nuclear Antibody (ANA): NEGATIVE

## 2020-01-30 LAB — IGG: IgG (Immunoglobin G), Serum: 1044 mg/dL (ref 600–1640)

## 2020-01-30 LAB — TISSUE TRANSGLUTAMINASE, IGA: (tTG) Ab, IgA: <1 U/mL

## 2020-01-30 LAB — MITOCHONDRIAL ANTIBODIES: Mitochondrial M2 Ab, IgG: <=20 U

## 2020-01-30 LAB — FERRITIN: Ferritin: 172 ng/mL (ref 38–380)

## 2020-01-30 LAB — IGA: Immunoglobulin A: 155 mg/dL (ref 47–310)

## 2020-02-02 ENCOUNTER — Encounter (INDEPENDENT_AMBULATORY_CARE_PROVIDER_SITE_OTHER): Payer: Self-pay

## 2020-02-20 ENCOUNTER — Encounter (INDEPENDENT_AMBULATORY_CARE_PROVIDER_SITE_OTHER): Payer: Self-pay

## 2020-02-21 ENCOUNTER — Other Ambulatory Visit (INDEPENDENT_AMBULATORY_CARE_PROVIDER_SITE_OTHER): Payer: Self-pay | Admitting: Gastroenterology

## 2020-02-21 NOTE — Progress Notes (Signed)
Alex Burke, can you please call the patient and schedule a EGD with possible esophageal dilation? Room 1. Dx: dysphagia.  Thanks,  [name], MD Gastroenterology and Hepatology [facility]

## 2020-02-22 ENCOUNTER — Other Ambulatory Visit (INDEPENDENT_AMBULATORY_CARE_PROVIDER_SITE_OTHER): Payer: Self-pay

## 2020-02-22 ENCOUNTER — Encounter (INDEPENDENT_AMBULATORY_CARE_PROVIDER_SITE_OTHER): Payer: Self-pay

## 2020-02-23 NOTE — Progress Notes (Signed)
Excellent. Thanks!

## 2020-02-23 NOTE — Progress Notes (Signed)
He is scheduled on 02/29/20 and is aware

## 2020-02-28 ENCOUNTER — Other Ambulatory Visit (HOSPITAL_COMMUNITY)
Admission: RE | Admit: 2020-02-28 | Discharge: 2020-02-28 | Disposition: A | Discharge status: Home / Self Care | Payer: BC Managed Care – PPO | Source: Ambulatory Visit | Attending: Gastroenterology | Admitting: Gastroenterology

## 2020-02-28 ENCOUNTER — Other Ambulatory Visit: Payer: Self-pay

## 2020-02-28 DIAGNOSIS — Z01812 Encounter for preprocedural laboratory examination: Secondary | ICD-10-CM | POA: Insufficient documentation

## 2020-02-28 DIAGNOSIS — K581 Irritable bowel syndrome with constipation: Secondary | ICD-10-CM | POA: Diagnosis not present

## 2020-02-28 DIAGNOSIS — R1010 Upper abdominal pain, unspecified: Secondary | ICD-10-CM | POA: Diagnosis present

## 2020-02-28 DIAGNOSIS — Z79899 Other long term (current) drug therapy: Secondary | ICD-10-CM | POA: Diagnosis not present

## 2020-02-28 DIAGNOSIS — Z20822 Contact with and (suspected) exposure to covid-19: Secondary | ICD-10-CM | POA: Insufficient documentation

## 2020-02-28 DIAGNOSIS — Z7982 Long term (current) use of aspirin: Secondary | ICD-10-CM | POA: Diagnosis not present

## 2020-02-28 DIAGNOSIS — Z87891 Personal history of nicotine dependence: Secondary | ICD-10-CM | POA: Diagnosis not present

## 2020-02-28 DIAGNOSIS — R1310 Dysphagia, unspecified: Secondary | ICD-10-CM | POA: Diagnosis not present

## 2020-02-28 DIAGNOSIS — K7581 Nonalcoholic steatohepatitis (NASH): Secondary | ICD-10-CM | POA: Diagnosis not present

## 2020-02-28 DIAGNOSIS — K2950 Unspecified chronic gastritis without bleeding: Secondary | ICD-10-CM | POA: Diagnosis not present

## 2020-02-28 DIAGNOSIS — R12 Heartburn: Secondary | ICD-10-CM | POA: Diagnosis not present

## 2020-02-28 DIAGNOSIS — K219 Gastro-esophageal reflux disease without esophagitis: Secondary | ICD-10-CM | POA: Diagnosis not present

## 2020-02-28 DIAGNOSIS — K319 Disease of stomach and duodenum, unspecified: Secondary | ICD-10-CM | POA: Diagnosis not present

## 2020-02-28 LAB — SARS CORONAVIRUS 2 (TAT 6-24 HRS): SARS Coronavirus 2: NEGATIVE

## 2020-02-29 ENCOUNTER — Ambulatory Visit (HOSPITAL_COMMUNITY): Payer: BC Managed Care – PPO | Admitting: Anesthesiology

## 2020-02-29 ENCOUNTER — Encounter (HOSPITAL_COMMUNITY)
Admission: RE | Disposition: A | Discharge status: Home / Self Care | Payer: Self-pay | Source: Home / Self Care | Attending: Gastroenterology

## 2020-02-29 ENCOUNTER — Encounter (HOSPITAL_COMMUNITY): Payer: Self-pay | Admitting: Gastroenterology

## 2020-02-29 ENCOUNTER — Ambulatory Visit (HOSPITAL_COMMUNITY)
Admission: RE | Admit: 2020-02-29 | Discharge: 2020-02-29 | Disposition: A | Discharge status: Home / Self Care | Payer: BC Managed Care – PPO | Attending: Gastroenterology | Admitting: Gastroenterology

## 2020-02-29 DIAGNOSIS — K581 Irritable bowel syndrome with constipation: Secondary | ICD-10-CM | POA: Insufficient documentation

## 2020-02-29 DIAGNOSIS — R1010 Upper abdominal pain, unspecified: Secondary | ICD-10-CM | POA: Diagnosis not present

## 2020-02-29 DIAGNOSIS — K219 Gastro-esophageal reflux disease without esophagitis: Secondary | ICD-10-CM | POA: Insufficient documentation

## 2020-02-29 DIAGNOSIS — R1310 Dysphagia, unspecified: Secondary | ICD-10-CM | POA: Diagnosis not present

## 2020-02-29 DIAGNOSIS — K2950 Unspecified chronic gastritis without bleeding: Secondary | ICD-10-CM | POA: Insufficient documentation

## 2020-02-29 DIAGNOSIS — Z20822 Contact with and (suspected) exposure to covid-19: Secondary | ICD-10-CM | POA: Insufficient documentation

## 2020-02-29 DIAGNOSIS — R12 Heartburn: Secondary | ICD-10-CM | POA: Diagnosis not present

## 2020-02-29 DIAGNOSIS — R109 Unspecified abdominal pain: Secondary | ICD-10-CM

## 2020-02-29 DIAGNOSIS — K3189 Other diseases of stomach and duodenum: Secondary | ICD-10-CM

## 2020-02-29 DIAGNOSIS — K319 Disease of stomach and duodenum, unspecified: Secondary | ICD-10-CM | POA: Insufficient documentation

## 2020-02-29 DIAGNOSIS — K2289 Other specified disease of esophagus: Secondary | ICD-10-CM | POA: Diagnosis not present

## 2020-02-29 DIAGNOSIS — Z87891 Personal history of nicotine dependence: Secondary | ICD-10-CM | POA: Insufficient documentation

## 2020-02-29 DIAGNOSIS — K7581 Nonalcoholic steatohepatitis (NASH): Secondary | ICD-10-CM | POA: Insufficient documentation

## 2020-02-29 DIAGNOSIS — Z79899 Other long term (current) drug therapy: Secondary | ICD-10-CM | POA: Insufficient documentation

## 2020-02-29 DIAGNOSIS — Z7982 Long term (current) use of aspirin: Secondary | ICD-10-CM | POA: Insufficient documentation

## 2020-02-29 HISTORY — PX: ESOPHAGEAL DILATION: SHX303

## 2020-02-29 HISTORY — PX: BIOPSY: SHX5522

## 2020-02-29 HISTORY — PX: ESOPHAGOGASTRODUODENOSCOPY (EGD) WITH PROPOFOL: SHX5813

## 2020-02-29 SURGERY — ESOPHAGOGASTRODUODENOSCOPY (EGD) WITH PROPOFOL
Anesthesia: General

## 2020-02-29 MED ORDER — FAMOTIDINE 40 MG PO TABS: 40.0000 mg | ORAL_TABLET | ORAL | 1 refills | Status: DC | PRN

## 2020-02-29 MED ORDER — LACTATED RINGERS IV SOLN
INTRAVENOUS | Status: DC
  Administered 2020-02-29: 09:00:00 1000 mL via INTRAVENOUS

## 2020-02-29 MED ORDER — LIDOCAINE HCL (CARDIAC) PF 100 MG/5ML IV SOSY
PREFILLED_SYRINGE | INTRAVENOUS | Status: DC | PRN
  Administered 2020-02-29: 100 mg via INTRAVENOUS

## 2020-02-29 MED ORDER — PROPOFOL 10 MG/ML IV BOLUS
INTRAVENOUS | Status: DC | PRN
  Administered 2020-02-29: 30 mg via INTRAVENOUS
  Administered 2020-02-29: 120 mg via INTRAVENOUS
  Administered 2020-02-29 (×2): 50 mg via INTRAVENOUS
  Administered 2020-02-29: 60 mg via INTRAVENOUS
  Administered 2020-02-29 (×2): 50 mg via INTRAVENOUS
  Administered 2020-02-29: 40 mg via INTRAVENOUS

## 2020-02-29 NOTE — Anesthesia Postprocedure Evaluation (Signed)
Anesthesia Post Note  Patient: CEBASTIAN NEIS  Procedure(s) Performed: ESOPHAGOGASTRODUODENOSCOPY (EGD) WITH PROPOFOL (N/A ) ESOPHAGEAL DILATION (N/A ) BIOPSY  Patient location during evaluation: Phase II Anesthesia Type: General Level of consciousness: awake and oriented Pain management: pain level controlled Vital Signs Assessment: post-procedure vital signs reviewed and stable Respiratory status: spontaneous breathing and respiratory function stable Cardiovascular status: blood pressure returned to baseline and stable Postop Assessment: no apparent nausea or vomiting Anesthetic complications: no   No complications documented.   Last Vitals:  Vitals:   02/29/20 0825  BP: 140/84  Pulse: 72  Resp: 12  Temp: 37.1 C  SpO2: 99%    Last Pain:  Vitals:   02/29/20 0927  TempSrc:   PainSc: 0-No pain                 Lorin Glass

## 2020-02-29 NOTE — Transfer of Care (Signed)
Immediate Anesthesia Transfer of Care Note  Patient: Alex Burke  Procedure(s) Performed: ESOPHAGOGASTRODUODENOSCOPY (EGD) WITH PROPOFOL (N/A ) ESOPHAGEAL DILATION (N/A ) BIOPSY  Patient Location: PACU  Anesthesia Type:General  Level of Consciousness: drowsy  Airway & Oxygen Therapy: Patient Spontanous Breathing  Post-op Assessment: Report given to RN and Post -op Vital signs reviewed and stable  Post vital signs: Reviewed and stable  Last Vitals:  Vitals Value Taken Time  BP    Temp    Pulse    Resp    SpO2      Last Pain:  Vitals:   02/29/20 0927  TempSrc:   PainSc: 0-No pain         Complications: No complications documented.

## 2020-02-29 NOTE — H&P (Signed)
Alex Burke is an 29 y.o. male.   Chief Complaint: Dysphagia and abdominal pain HPI: Alex Burke is a 29 y.o. male with PMH NASH, IBS-C, GERD, who presents for evaluation of dysphagia and abdominal pain.  The patient reported new onset of upper abdominal pain and episodes of heartburn despite taking Nexium every day compliantly.  He also endorsed having some dysphagia with solids occasionally without significant regurgitation.  Has not presented any weight loss, odynophagia. The patient denies having any nausea, vomiting, fever, chills, hematochezia, melena, hematemesis, abdominal distention,  diarrhea, jaundice, pruritus or weight loss.  Patient reports that he quit taking Nexium 4 days ago and his symptoms have much improved has very occasional dysphagia and his abdominal pain is resolved.  States he he has only present with heartburn when he eats spicy food.  No previous EGD.   Past Medical History:  Diagnosis Date  . GERD (gastroesophageal reflux disease)     Past Surgical History:  Procedure Laterality Date  . COLONOSCOPY N/A 06/10/2019   Procedure: COLONOSCOPY;  Surgeon: Malissa Hippo, MD;  [facility];  Service: Endoscopy;  Laterality: N/A;  955  . SHOULDER SURGERY Left     Family History  Problem Relation Age of Onset  . COPD Mother   . Barrett's esophagus Mother   . Hypertension Mother   . Bladder Cancer Mother   . Diabetes Father   . GER disease Father    Social History:  reports that he has never smoked. He quit smokeless tobacco use about a year ago.  His smokeless tobacco use included chew. He reports that he does not drink alcohol and does not use drugs.  Allergies:  Allergies  Allergen Reactions  . Amoxicillin Rash    Did it involve swelling of the face/tongue/throat, SOB, or low BP? No Did it involve sudden or severe rash/hives, skin peeling, or any reaction on the inside of your mouth or nose? No Did you need to seek medical attention  at a hospital or doctor's office? No When did it last happen?Jan 2021 If all above answers are "NO", may proceed with cephalosporin use.   . Sulfur Shortness Of Breath and Rash  . Allegra [Fexofenadine] Nausea Only    Medications Prior to Admission  Medication Sig Dispense Refill  . aspirin EC 81 MG tablet Take 81 mg by mouth daily. Swallow whole.    . esomeprazole (NEXIUM) 40 MG capsule Take 1 capsule (40 mg total) by mouth daily. 30 capsule 5  . Multiple Vitamins-Minerals (MULTIVITAMIN WITH MINERALS) tablet Take 1 tablet by mouth daily.    . Omega-3 Fatty Acids (FISH OIL) 1000 MG CAPS Take 1,000 mg by mouth daily.    Marland Kitchen acetaminophen (TYLENOL) 500 MG tablet Take 500 mg by mouth every 8 (eight) hours as needed for moderate pain or headache.    . dicyclomine (BENTYL) 10 MG capsule Take 1 capsule (10 mg total) by mouth 3 (three) times daily as needed (abd pain). 90 capsule 3    Results for orders placed or performed during the hospital encounter of 02/28/20 (from the past 48 hour(s))  SARS CORONAVIRUS 2 (TAT 6-24 HRS) Nasopharyngeal Nasopharyngeal Swab     Status: None   Collection Time: 02/28/20  9:13 AM   Specimen: Nasopharyngeal Swab  Result Value Ref Range   SARS Coronavirus 2 NEGATIVE NEGATIVE    Comment: (NOTE) SARS-CoV-2 target nucleic acids are NOT DETECTED.  The SARS-CoV-2 RNA is generally detectable in upper and lower  respiratory specimens during the acute phase of infection. Negative results do not preclude SARS-CoV-2 infection, do not rule out co-infections with other pathogens, and should not be used as the sole basis for treatment or other patient management decisions. Negative results must be combined with clinical observations, patient history, and epidemiological information. The expected result is Negative.  Fact Sheet for Patients: HairSlick.no  Fact Sheet for Healthcare  Providers: quierodirigir.com  This test is not yet approved or cleared by the [place] FDA and  has been authorized for detection and/or diagnosis of SARS-CoV-2 by FDA under an Emergency Use Authorization (EUA). This EUA will remain  in effect (meaning this test can be used) for the duration of the COVID-19 declaration under Se ction 564(b)(1) of the Act, 21 U.S.C. section 360bbb-3(b)(1), unless the authorization is terminated or revoked sooner.  Performed at [facility], [address] [place], [place] [address]    No results found.  Review of Systems  Constitutional: Negative.   HENT: Positive for trouble swallowing.   Eyes: Negative.   Respiratory: Negative.   Gastrointestinal: Positive for abdominal pain.  Endocrine: Negative.   Genitourinary: Negative.   Musculoskeletal: Negative.   Skin: Negative.   Allergic/Immunologic: Negative.   Neurological: Negative.   Hematological: Negative.   Psychiatric/Behavioral: Negative.     Blood pressure 140/84, pulse 72, temperature 98.7 F (37.1 C), temperature source Oral, resp. rate 12, height 5\' 8"  (1.727 m), weight 107.5 kg, SpO2 99 %. Physical Exam  GENERAL: The patient is AO x3, in no acute distress. Obese. HEENT: Head is normocephalic and atraumatic. EOMI are intact. Mouth is well hydrated and without lesions. NECK: Supple. No masses LUNGS: Clear to auscultation. No presence of rhonchi/wheezing/rales. Adequate chest expansion HEART: RRR, normal s1 and s2. ABDOMEN: Soft, nontender, no guarding, no peritoneal signs, and nondistended. BS +. No masses. EXTREMITIES: Without any cyanosis, clubbing, rash, lesions or edema. NEUROLOGIC: AOx3, no focal motor deficit. SKIN: no jaundice, no rashes  Assessment/Plan Alex Burke is a 29 y.o. male with PMH NASH, IBS-C, GERD, who presents for evaluation of dysphagia and abdominal pain.  We will proceed with EGD with possible dilation.   38, MD 02/29/2020, 8:36 AM

## 2020-02-29 NOTE — Op Note (Signed)
[facility] Patient Name: Alex Burke Procedure Date: 02/29/2020 9:21 AM MRN: [id number] Date of Birth: September 06, 1990 Attending MD: [name] ,  CSN: [id number] Age: 29 Admit Type: Outpatient Procedure:                Upper GI endoscopy Indications:              Abdominal pain, Dysphagia, Heartburn Providers:                [name], Crystal Page, Kristine L. Jessee Avers, Technician, Edythe Clarity, Pensions consultant Referring MD:              Medicines:                Monitored Anesthesia Care Complications:            No immediate complications. Estimated Blood Loss:     Estimated blood loss: none. Procedure:                Pre-Anesthesia Assessment:                           - Prior to the procedure, a History and Physical                            was performed, and patient medications, allergies                            and sensitivities were reviewed. The patient's                            tolerance of previous anesthesia was reviewed.                           - The risks and benefits of the procedure and the                            sedation options and risks were discussed with the                            patient. All questions were answered and informed                            consent was obtained.                           After obtaining informed consent, the endoscope was                            passed under direct vision. Throughout the                            procedure, the patient's blood pressure, pulse, and                            oxygen saturations were  monitored continuously. The                            Endoscope was introduced through the mouth, and                            advanced to the second part of duodenum. The upper                            GI endoscopy was accomplished without difficulty.                            The patient tolerated the procedure well. Scope In: 9:32:30 AM Scope Out:  9:47:52 AM Total Procedure Duration: 0 hours 15 minutes 22 seconds  Findings:      The Z-line was irregular and was found 36 cm from the incisors.      No endoscopic abnormality was evident in the esophagus to explain the       patient's complaint of dysphagia. It was decided, however, to proceed       with dilation of the entire esophagus. A guidewire was placed and the       scope was withdrawn. Dilation was performed with a Savary dilator with       no resistance at 18 mm.      Localized erythematous mucosa without bleeding was found in the gastric       antrum. Biopsies were taken with a cold forceps for Helicobacter pylori       testing.      The examined duodenum was normal. Impression:               - Z-line irregular, 36 cm from the incisors.                           - No endoscopic esophageal abnormality to explain                            patient's dysphagia. Dilated.                           - Erythematous mucosa in the antrum. Biopsied.                           - Normal examined duodenum. Moderate Sedation:      Per Anesthesia Care Recommendation:           - Discharge patient to home (ambulatory).                           - Resume previous diet.                           - Use Pepcid (famotidine) 40 mg PO as needed daily.                           - Await pathology results. Procedure Code(s):        --- Professional ---  [phone], GC, Esophagogastroduodenoscopy, flexible,                            transoral; with insertion of guide wire followed by                            passage of dilator(s) through esophagus over guide                            wire                           [id number], 59, Esophagogastroduodenoscopy, flexible,                            transoral; with biopsy, single or multiple Diagnosis Code(s):        --- Professional ---                           K22.8, Other specified diseases of esophagus                            R13.10, Dysphagia, unspecified                           K31.89, Other diseases of stomach and duodenum                           R10.9, Unspecified abdominal pain                           R12, Heartburn CPT copyright 2019 American Medical Association. All rights reserved. The codes documented in this report are preliminary and upon coder review may  be revised to meet current compliance requirements. [name], MD [name],  02/29/2020 9:59:46 AM This report has been signed electronically. Number of Addenda: 0

## 2020-02-29 NOTE — OR Nursing (Signed)
Alex Burke was at [facility] on 02/29/20 and cannot return to work until 11:00am on 03/01/20.

## 2020-02-29 NOTE — Discharge Instructions (Signed)
You are being discharged to home.  Resume your previous diet.  Take Pepcid (famotidine) 40 mg by mouth as needed daily.  We are waiting for your pathology results.    Upper Endoscopy, Adult, Care After This sheet gives you information about how to care for yourself after your procedure. Your health care provider may also give you more specific instructions. If you have problems or questions, contact your health care provider. What can I expect after the procedure? After the procedure, it is common to have:  A sore throat.  Mild stomach pain or discomfort.  Bloating.  Nausea. Follow these instructions at home:   Follow instructions from your health care provider about what to eat or drink after your procedure.  Return to your normal activities as told by your health care provider. Ask your health care provider what activities are safe for you.  Take over-the-counter and prescription medicines only as told by your health care provider.  Do not drive for 24 hours if you were given a sedative during your procedure.  Keep all follow-up visits as told by your health care provider. This is important. Contact a health care provider if you have:  A sore throat that lasts longer than one day.  Trouble swallowing. Get help right away if:  You vomit blood or your vomit looks like coffee grounds.  You have: ? A fever. ? Bloody, black, or tarry stools. ? A severe sore throat or you cannot swallow. ? Difficulty breathing. ? Severe pain in your chest or abdomen. Summary  After the procedure, it is common to have a sore throat, mild stomach discomfort, bloating, and nausea.  Do not drive for 24 hours if you were given a sedative during the procedure.  Follow instructions from your health care provider about what to eat or drink after your procedure.  Return to your normal activities as told by your health care provider. This information is not intended to replace advice given to  you by your health care provider. Make sure you discuss any questions you have with your health care provider. Document Revised: 08/26/2017 Document Reviewed: 08/04/2017 Elsevier Patient Education  2020 [employer].

## 2020-02-29 NOTE — Anesthesia Preprocedure Evaluation (Signed)
Anesthesia Evaluation  Patient identified by MRN, date of birth, ID band Patient awake    Reviewed: Allergy & Precautions, H&P , NPO status , Patient's Chart, lab work & pertinent test results, reviewed documented beta blocker date and time   Airway Mallampati: II  TM Distance: >3 FB Neck ROM: full    Dental no notable dental hx.    Pulmonary neg pulmonary ROS,    Pulmonary exam normal breath sounds clear to auscultation       Cardiovascular Exercise Tolerance: Good negative cardio ROS   Rhythm:regular Rate:Normal     Neuro/Psych PSYCHIATRIC DISORDERS Anxiety negative neurological ROS     GI/Hepatic GERD  Medicated,(+) Hepatitis -, Autoimmune  Endo/Other  negative endocrine ROS  Renal/GU negative Renal ROS  negative genitourinary   Musculoskeletal   Abdominal   Peds  Hematology negative hematology ROS (+)   Anesthesia Other Findings   Reproductive/Obstetrics negative OB ROS                             Anesthesia Physical Anesthesia Plan  ASA: II  Anesthesia Plan: General   Post-op Pain Management:    Induction:   PONV Risk Score and Plan: Propofol infusion  Airway Management Planned:   Additional Equipment:   Intra-op Plan:   Post-operative Plan:   Informed Consent: I have reviewed the patients History and Physical, chart, labs and discussed the procedure including the risks, benefits and alternatives for the proposed anesthesia with the patient or authorized representative who has indicated his/her understanding and acceptance.     Dental Advisory Given  Plan Discussed with: CRNA  Anesthesia Plan Comments:         Anesthesia Quick Evaluation

## 2020-03-01 ENCOUNTER — Other Ambulatory Visit: Payer: Self-pay

## 2020-03-01 LAB — SURGICAL PATHOLOGY

## 2020-03-07 ENCOUNTER — Telehealth: Payer: Self-pay

## 2020-03-07 ENCOUNTER — Encounter (HOSPITAL_COMMUNITY): Payer: Self-pay | Admitting: Gastroenterology

## 2020-03-07 NOTE — Telephone Encounter (Signed)
NOTES ON FILE FROM [facility] [phone], SENT REFERRAL TO SCHEDULING 

## 2020-03-08 ENCOUNTER — Telehealth: Payer: Self-pay

## 2020-03-08 NOTE — Telephone Encounter (Signed)
NOTES ON FILE FROM [facility] [phone], SENT REFERRAL TO SCHEDULING 

## 2020-03-29 ENCOUNTER — Other Ambulatory Visit: Payer: Self-pay

## 2020-03-29 ENCOUNTER — Encounter: Payer: Self-pay | Admitting: Internal Medicine

## 2020-03-29 ENCOUNTER — Ambulatory Visit: Payer: BC Managed Care – PPO | Admitting: Internal Medicine

## 2020-03-29 VITALS — BP 122/86 | HR 63 | Ht 68.0 in | Wt 244.8 lb

## 2020-03-29 DIAGNOSIS — I10 Essential (primary) hypertension: Secondary | ICD-10-CM | POA: Diagnosis not present

## 2020-03-29 DIAGNOSIS — Z0000 Encounter for general adult medical examination without abnormal findings: Secondary | ICD-10-CM | POA: Diagnosis not present

## 2020-03-29 DIAGNOSIS — R079 Chest pain, unspecified: Secondary | ICD-10-CM

## 2020-03-29 NOTE — Progress Notes (Signed)
Cardiology Office Note:    Date:  03/29/2020   ID:  Alex Burke, DOB 07-Jun-1990, MRN [id number]  PCP:  Alex Burke  [facility] Cardiologist:  Alex Constant, MD  [facility] Electrophysiologist:  None   CC: Establish care Consulted for the evaluation of chest pain at the behest of Alex Burke, [place]  History of Present Illness:    Alex Burke is a 30 y.o. male with a hx of Morbid Obesity with HTN, history of COVID-19, Former tobacco (one year off) who presents with chest pain.  Patient notes that he is feeling chest pain. As part of prior chest pain evaluation was seen at [facility] with negative stress echocardiogram.  Had COVID-19 in August; presented with high fevers 103, severe chest pain, and pressure.  Took Aspirin and went to the hospital.  Took Maalox and lidocaine; noted his heart rate  was really high at that time; associated with pain.  Had CTPE  that showed no blood clot.  Saw [facility] and had normal echo and normal stress test.  Never heard the results.    Changed eating habits and decrease calories (mediterannean diet).  Started weight lifting January 1st.  03/28/20 noted five minutes of chest pressure that radiated to his arm.  Thought it was indigestion.  Father died from COVID-19 and has been dealing with this.  Discomfort occurs after workouts but after cardio feels no symptoms, and improves with time.  Patient exertion notable for battle ropes and step exercises with  and feels no symptoms.  No shortness of breath, DOE .  No PND or orthopnea.  No bendopnea, notes intentional weight loss,denies  leg swelling , or abdominal swelling.  No syncope or near syncope .  Notes  no palpitations or funny heart beats.     Ambulatory BP not done.   Past Medical History:  Diagnosis Date  . Acute bilateral low back pain without sciatica   . COVID-19   . GERD (gastroesophageal reflux disease)   . SOB (shortness of breath)     Past  Surgical History:  Procedure Laterality Date  . BIOPSY  02/29/2020   Procedure: BIOPSY;  Surgeon: Alex Frame, MD;  [facility];  Service: Gastroenterology;;  . COLONOSCOPY N/A 06/10/2019   Procedure: COLONOSCOPY;  Surgeon: Alex Hippo, MD;  [facility];  Service: Endoscopy;  Laterality: N/A;  955  . ESOPHAGEAL DILATION N/A 02/29/2020   Procedure: ESOPHAGEAL DILATION;  Surgeon: Alex Frame, MD;  [facility];  Service: Gastroenterology;  Laterality: N/A;  . ESOPHAGOGASTRODUODENOSCOPY (EGD) WITH PROPOFOL N/A 02/29/2020   Procedure: ESOPHAGOGASTRODUODENOSCOPY (EGD) WITH PROPOFOL;  Surgeon: Alex Frame, MD;  [facility];  Service: Gastroenterology;  Laterality: N/A;  11:30  . SHOULDER SURGERY Left     Current Medications: Current Meds  Medication Sig  . acetaminophen (TYLENOL) 500 MG tablet Take 500 mg by mouth every 8 (eight) hours as needed for moderate pain or headache.  Marland Kitchen aspirin EC 81 MG tablet Take 81 mg by mouth daily. Swallow whole.  . Multiple Vitamins-Minerals (MULTIVITAMIN WITH MINERALS) tablet Take 1 tablet by mouth daily.  . Omega-3 Fatty Acids (FISH OIL) 1000 MG CAPS Take 1,000 mg by mouth daily.  . [DISCONTINUED] famotidine (PEPCID) 40 MG tablet Take 1 tablet (40 mg total) by mouth as needed for heartburn.     Allergies:   Amoxicillin, Sulfur, and Allegra [fexofenadine]   Social History  Socioeconomic History  . Marital status: Married    Spouse name: Not on file  . Number of children: Not on file  . Years of education: Not on file  . Highest education level: Not on file  Occupational History  . Not on file  Tobacco Use  . Smoking status: Never Smoker  . Smokeless tobacco: Former Neurosurgeon    Types: Engineer, drilling  . Vaping Use: Never used  Substance and Sexual Activity  . Alcohol use: No  . Drug use: No  . Sexual activity: Not on file  Other Topics Concern  . Not on  file  Social History Narrative  . Not on file   Social Determinants of Health   Financial Resource Strain: Not on file  Food Insecurity: Not on file  Transportation Needs: Not on file  Physical Activity: Not on file  Stress: Not on file  Social Connections: Not on file     Family History: The patient's family history includes Barrett's esophagus in his mother; Bladder Cancer in his mother; COPD in his mother; Diabetes in his father; GER disease in his father; Hypertension in his mother. History of coronary artery disease notable for four uncles. History of heart failure notable for one uncle, brother had enlarge heart valve. History of PFO in uncle History of arrhythmia notable for no members.  ROS:   Please see the history of present illness.     All other systems reviewed and are negative.  EKGs/Labs/Other Studies Reviewed:    The following studies were reviewed today:  EKG:   03/29/20: NSR 63 rate WNL 11/05/19: NSR 60 WNL  Transthoracic Echocardiogram: Date: 11/05/19 Results: [facility] Left Ventricle Normal left ventricular size. Wall thickness is normal. EF: 60-65%. Wall motion is normal. Doppler parameters indicate normal diastolic function.  Right Ventricle Right ventricle appears normal. Systolic function is normal.  Left Atrium Left atrium is normal in size.  Right Atrium Normal sized right atrium.  Mitral Valve Mitral valve structure is normal. There is mild annular calcification. There is no mitral regurgitation.  Tricuspid Valve Tricuspid valve structure is normal. There is trace regurgitation. The right ventricular systolic pressure is normal (<36 mmHg).  Aortic Valve The aortic valve is tricuspid. The leaflets are not thickened and exhibit normal excursion. There is mild sclerosis. There is no regurgitation or stenosis.  Pulmonic Valve Trace regurgitation. No pulmonic stenosis present.  Ascending Aorta The aortic root is normal in  size.  Pericardium There is no pericardial effusion.   Stress Echo [facility]: Date: 01/14/2020 Results: CPACS - 01/14/2020 11:19 AM EDT  LeftVentricle: EF: 60-65%. . Stress: Total exercise time was 6 minutes and 59 seconds. The patient  achieved and a maximal blood pressure of 172/94. Marland Kitchen Post-stress: The left ventricle systolic function is hyperdynamic  post-stress. Marland Kitchen Post-stress: The post-stress echo showed normal wall motion. Marland Kitchen Post-stressImpression: The study is normal. . Post-stressImpression: This study shows a low prognostic risk. Marland Kitchen Post-stressImpression: The ECG and Echo portions of the stress study  are concordant with no evidence of inducible myocardial ischemia.  Recent Labs: 01/27/2020: ALT 41; BUN 16; Creat 0.72; Hemoglobin 16.1; Platelets 367; Potassium 4.4; Sodium 141  Recent Lipid Panel No results found for: CHOL, TRIG, HDL, CHOLHDL, VLDL, LDLCALC, LDLDIRECT   Risk Assessment/Calculations:     N/A  Physical Exam:    VS:  BP 122/86   Pulse 63   Ht 5\' 8"  (1.727 m)   Wt 244 lb 12.8 oz (111 kg)  SpO2 99%   BMI 37.22 kg/m     Wt Readings from Last 3 Encounters:  03/29/20 244 lb 12.8 oz (111 kg)  02/29/20 237 lb (107.5 kg)  01/27/20 244 lb 11.2 oz (111 kg)    GEN: Obese male, well developed in no acute distress HEENT: Normal NECK: No JVD; No carotid bruits LYMPHATICS: No lymphadenopathy CARDIAC: RRR, no murmurs, rubs, gallops RESPIRATORY:  Clear to auscultation without rales, wheezing or rhonchi  ABDOMEN: Soft, non-tender, non-distended MUSCULOSKELETAL:  No edema; No deformity  SKIN: Warm and dry NEUROLOGIC:  Alert and oriented x 3 PSYCHIATRIC:  Normal affect   ASSESSMENT:    1. Chest pain of uncertain etiology   2. Encounter for preventive care   3. Morbid obesity (HCC)   4. Essential hypertension    PLAN:    In order of problems listed above:  Chest Pain - The patient presents with non-cardiac chest pain associated with  weight lifting and with recent negative stress; will proceed as below  Cardiac Preventive Visit Morbid Obesity with HTN AHA Life's Simple Seven- People with at least five ideal Life's Simple 7 metrics had a 78% reduced risk for heart-related death compared to people with no ideal metrics. - Discussed recommendation of Mediterranean and Dash Diets; discussed the evidence behind vegan diets - Discussed recommendations for 150 minutes weekly exercise - Discussed the important of blood sugar and blood pressure control - offered CAC scoring and aggressive cholesterol management- will pursue - stress the important of a smoke free environment - though BMI is not a perfect measurement in all patient populations; a BMI < 30 can improve cardiac outcomes; present BMI is 37 Essential Hypertension with multiple elevated BP readings - ambulatory blood pressure not done, will start ambulatory BP monitoring; gave education on how to perform ambulatory blood pressure monitoring including the frequency and technique; goal ambulatory blood pressure < 135/85 on average   3 month follow up unless new symptoms or abnormal test results warranting change in plan  Would be reasonable for  Virtual Follow up Would be reasonable for  APP Follow up       Medication Adjustments/Labs and Tests Ordered: Current medicines are reviewed at length with the patient today.  Concerns regarding medicines are outlined above.  Orders Placed This Encounter  Procedures  . CT CARDIAC SCORING (SELF PAY ONLY)  . EKG 12-Lead   No orders of the defined types were placed in this encounter.   Patient Instructions  Medication Instructions:  Your physician recommends that you continue on your current medications as directed. Please refer to the Current Medication list given to you today.  *If you need a refill on your cardiac medications before your next appointment, please call your pharmacy*   Lab Work: none If you have labs  (blood work) drawn today and your tests are completely normal, you will receive your results only by: Marland Kitchen MyChart Message (if you have MyChart) OR . A paper copy in the mail If you have any lab test that is abnormal or we need to change your treatment, we will call you to review the results.   Testing/Procedures: Your physician has recommended you have a Calcium Score CT   Follow-Up: At [facility], you and your health needs are our priority.  As part of our continuing mission to provide you with exceptional heart care, we have created designated Provider Care Teams.  These Care Teams include your primary Cardiologist (physician) and Advanced Practice Providers (APPs -  Physician Assistants and Nurse Practitioners) who all work together to provide you with the care you need, when you need it.  We recommend signing up for the patient portal called "MyChart".  Sign up information is provided on this After Visit Summary.  MyChart is used to connect with patients for Virtual Visits (Telemedicine).  Patients are able to view lab/test results, encounter notes, upcoming appointments, etc.  Non-urgent messages can be sent to your provider as well.   To learn more about what you can do with MyChart, go to [email or web address].    Your next appointment:   3 month(s)  The format for your next appointment:   In Person  Provider:   You may see Alex ConstantMahesh A Casmere Hollenbeck, MD or one of the following Advanced Practice Providers on your designated Care Team:    Ronie Spiesayna Dunn, PA-C  Jacolyn ReedyMichele Lenze, PA-C    Other Instructions       Signed, Alex ConstantMahesh A Cortana Vanderford, MD  03/29/2020 9:36 AM    [facility]

## 2020-03-29 NOTE — Patient Instructions (Signed)
Medication Instructions:  Your physician recommends that you continue on your current medications as directed. Please refer to the Current Medication list given to you today.  *If you need a refill on your cardiac medications before your next appointment, please call your pharmacy*   Lab Work: none If you have labs (blood work) drawn today and your tests are completely normal, you will receive your results only by: Marland Kitchen MyChart Message (if you have MyChart) OR . A paper copy in the mail If you have any lab test that is abnormal or we need to change your treatment, we will call you to review the results.   Testing/Procedures: Your physician has recommended you have a Calcium Score CT   Follow-Up: At [facility], you and your health needs are our priority.  As part of our continuing mission to provide you with exceptional heart care, we have created designated Provider Care Teams.  These Care Teams include your primary Cardiologist (physician) and Advanced Practice Providers (APPs -  Physician Assistants and Nurse Practitioners) who all work together to provide you with the care you need, when you need it.  We recommend signing up for the patient portal called "MyChart".  Sign up information is provided on this After Visit Summary.  MyChart is used to connect with patients for Virtual Visits (Telemedicine).  Patients are able to view lab/test results, encounter notes, upcoming appointments, etc.  Non-urgent messages can be sent to your provider as well.   To learn more about what you can do with MyChart, go to [email or web address].    Your next appointment:   3 month(s)  The format for your next appointment:   In Person  Provider:   You may see Christell Constant, MD or one of the following Advanced Practice Providers on your designated Care Team:    Ronie Spies, PA-C  Jacolyn Reedy, PA-C    Other Instructions

## 2020-04-07 ENCOUNTER — Other Ambulatory Visit: Payer: Self-pay

## 2020-04-07 ENCOUNTER — Inpatient Hospital Stay: Admission: RE | Admit: 2020-04-07 | Payer: BC Managed Care – PPO | Source: Ambulatory Visit

## 2020-04-07 ENCOUNTER — Ambulatory Visit (INDEPENDENT_AMBULATORY_CARE_PROVIDER_SITE_OTHER)
Admission: RE | Admit: 2020-04-07 | Discharge: 2020-04-07 | Disposition: A | Discharge status: Home / Self Care | Payer: Self-pay | Source: Ambulatory Visit | Attending: Internal Medicine | Admitting: Internal Medicine

## 2020-04-07 DIAGNOSIS — I10 Essential (primary) hypertension: Secondary | ICD-10-CM

## 2020-04-07 DIAGNOSIS — R079 Chest pain, unspecified: Secondary | ICD-10-CM

## 2020-04-07 DIAGNOSIS — Z0000 Encounter for general adult medical examination without abnormal findings: Secondary | ICD-10-CM

## 2020-04-21 ENCOUNTER — Ambulatory Visit: Payer: BC Managed Care – PPO | Admitting: Cardiovascular Disease

## 2020-06-26 NOTE — Progress Notes (Deleted)
Cardiology Office Note:    Date:  06/26/2020   ID:  Alex Burke, DOB 1990-05-06, MRN [id number]  PCP:  Lucila Maine  [facility] Cardiologist:  Christell Constant, MD  [facility] Electrophysiologist:  None   CC: Follow up chest pain  History of Present Illness:    Alex Burke is a 30 y.o. male with a hx of Morbid Obesity with HTN, history of COVID-19, Former tobacco (one year off) who presented with chest pain 03/2020.  Patient notes that he is doing ***.  Since day prior/last visit notes *** changes.  Relevant interval testing or therapy include ***.  There are no*** interval hospital/ED visit.    No chest pain or pressure ***.  No SOB/DOE*** and no PND/Orthopnea***.  No weight gain or leg swelling***.  No palpitations or syncope ***.  Ambulatory blood pressure ***.   Past Medical History:  Diagnosis Date  . Acute bilateral low back pain without sciatica   . COVID-19   . GERD (gastroesophageal reflux disease)   . SOB (shortness of breath)     Past Surgical History:  Procedure Laterality Date  . BIOPSY  02/29/2020   Procedure: BIOPSY;  Surgeon: Dolores Frame, MD;  [facility];  Service: Gastroenterology;;  . COLONOSCOPY N/A 06/10/2019   Procedure: COLONOSCOPY;  Surgeon: Malissa Hippo, MD;  [facility];  Service: Endoscopy;  Laterality: N/A;  955  . ESOPHAGEAL DILATION N/A 02/29/2020   Procedure: ESOPHAGEAL DILATION;  Surgeon: Dolores Frame, MD;  [facility];  Service: Gastroenterology;  Laterality: N/A;  . ESOPHAGOGASTRODUODENOSCOPY (EGD) WITH PROPOFOL N/A 02/29/2020   Procedure: ESOPHAGOGASTRODUODENOSCOPY (EGD) WITH PROPOFOL;  Surgeon: Dolores Frame, MD;  [facility];  Service: Gastroenterology;  Laterality: N/A;  11:30  . SHOULDER SURGERY Left     Current Medications: No outpatient medications have been marked as taking for the 07/03/20 encounter  (Appointment) with Christell Constant, MD.     Allergies:   Amoxicillin, Elemental sulfur, and Allegra [fexofenadine]   Social History   Socioeconomic History  . Marital status: Married    Spouse name: Not on file  . Number of children: Not on file  . Years of education: Not on file  . Highest education level: Not on file  Occupational History  . Not on file  Tobacco Use  . Smoking status: Never Smoker  . Smokeless tobacco: Former Neurosurgeon    Types: Engineer, drilling  . Vaping Use: Never used  Substance and Sexual Activity  . Alcohol use: No  . Drug use: No  . Sexual activity: Not on file  Other Topics Concern  . Not on file  Social History Narrative  . Not on file   Social Determinants of Health   Financial Resource Strain: Not on file  Food Insecurity: Not on file  Transportation Needs: Not on file  Physical Activity: Not on file  Stress: Not on file  Social Connections: Not on file     Family History: The patient's family history includes Barrett's esophagus in his mother; Bladder Cancer in his mother; COPD in his mother; Diabetes in his father; GER disease in his father; Hypertension in his mother. History of coronary artery disease notable for four uncles. History of heart failure notable for one uncle, brother had enlarge heart valve. History of PFO in uncle History of arrhythmia notable for no members.  ROS:   Please see the history of present  illness.     All other systems reviewed and are negative.  EKGs/Labs/Other Studies Reviewed:    The following studies were reviewed today:  EKG:   03/29/20: NSR 63 rate WNL 11/05/19: NSR 60 WNL  Transthoracic Echocardiogram: Date: 11/05/19 Results: [facility] Left Ventricle Normal left ventricular size. Wall thickness is normal. EF: 60-65%. Wall motion is normal. Doppler parameters indicate normal diastolic function.  Right Ventricle Right ventricle appears normal. Systolic function is normal.  Left  Atrium Left atrium is normal in size.  Right Atrium Normal sized right atrium.  Mitral Valve Mitral valve structure is normal. There is mild annular calcification. There is no mitral regurgitation.  Tricuspid Valve Tricuspid valve structure is normal. There is trace regurgitation. The right ventricular systolic pressure is normal (<36 mmHg).  Aortic Valve The aortic valve is tricuspid. The leaflets are not thickened and exhibit normal excursion. There is mild sclerosis. There is no regurgitation or stenosis.  Pulmonic Valve Trace regurgitation. No pulmonic stenosis present.  Ascending Aorta The aortic root is normal in size.  Pericardium There is no pericardial effusion.   Stress Echo [facility]: Date: 01/14/2020 Results: CPACS - 01/14/2020 11:19 AM EDT  LeftVentricle: EF: 60-65%. . Stress: Total exercise time was 6 minutes and 59 seconds. The patient  achieved and a maximal blood pressure of 172/94. Marland Kitchen Post-stress: The left ventricle systolic function is hyperdynamic  post-stress. Marland Kitchen Post-stress: The post-stress echo showed normal wall motion. Marland Kitchen Post-stressImpression: The study is normal. . Post-stressImpression: This study shows a low prognostic risk. Marland Kitchen Post-stressImpression: The ECG and Echo portions of the stress study  are concordant with no evidence of inducible myocardial ischemia.  Coronary Artery Calcium Score: Date: 04/07/20 Results: IMPRESSION: 1. Coronary calcium score of 0. This was 1st percentile for age, gender, and race matched controls.  Recent Labs: 01/27/2020: ALT 41; BUN 16; Creat 0.72; Hemoglobin 16.1; Platelets 367; Potassium 4.4; Sodium 141  Recent Lipid Panel No results found for: CHOL, TRIG, HDL, CHOLHDL, VLDL, LDLCALC, LDLDIRECT   Risk Assessment/Calculations:     N/A  Physical Exam:    VS:  There were no vitals taken for this visit.    Wt Readings from Last 3 Encounters:  03/29/20 244 lb 12.8 oz (111 kg)  02/29/20 237  lb (107.5 kg)  01/27/20 244 lb 11.2 oz (111 kg)    GEN: Obese male, well developed in no acute distress HEENT: Normal NECK: No JVD; No carotid bruits LYMPHATICS: No lymphadenopathy CARDIAC: RRR, no murmurs, rubs, gallops RESPIRATORY:  Clear to auscultation without rales, wheezing or rhonchi  ABDOMEN: Soft, non-tender, non-distended MUSCULOSKELETAL:  No edema; No deformity  SKIN: Warm and dry NEUROLOGIC:  Alert and oriented x 3 PSYCHIATRIC:  Normal affect   ASSESSMENT:    No diagnosis found. PLAN:    In order of problems listed above:  Chest Pain Follow up - ***  Morbid Obesity with HTN AHA Life's Simple Seven- People with at least five ideal Life's Simple 7 metrics had a 78% reduced risk for heart-related death compared to people with no ideal metrics. - Discussed recommendation of Mediterranean and Dash Diets; discussed the evidence behind vegan diets - Discussed recommendations for 150 minutes weekly exercise - Discussed the important of blood sugar and blood pressure control - CAC Scoring: 0 - stress the important of a smoke free environment - though BMI is not a perfect measurement in all patient populations; a BMI < 30 can improve cardiac outcomes; present BMI is 37 Essential Hypertension with  multiple elevated BP readings - ambulatory blood pressure ***, will *** ambulatory BP monitoring; gave education on how to perform ambulatory blood pressure monitoring including the frequency and technique; goal ambulatory blood pressure < 135/85 on average  *** follow up unless new symptoms or abnormal test results warranting change in plan  Would be reasonable for *** Video Visit Follow up  Would be reasonable for *** APP Follow up        Medication Adjustments/Labs and Tests Ordered: Current medicines are reviewed at length with the patient today.  Concerns regarding medicines are outlined above.  No orders of the defined types were placed in this encounter.  No orders  of the defined types were placed in this encounter.   There are no Patient Instructions on file for this visit.   Signed, Christell Constant, MD  06/26/2020 4:26 PM    [facility]

## 2020-07-03 ENCOUNTER — Ambulatory Visit: Payer: BC Managed Care – PPO | Admitting: Internal Medicine

## 2020-09-12 NOTE — Progress Notes (Deleted)
Cardiology Office Note:    Date:  09/12/2020   ID:  Alex Burke, DOB 02-13-1991, MRN [id number]  PCP:  Lucila Maine  [facility] Cardiologist:  Christell Constant, MD  [facility] Electrophysiologist:  None   CC: Follow up from Prevention Visit Consulted for the evaluation of chest pain at the behest of Teena Irani, [place]  History of Present Illness:    Alex Burke is a 30 y.o. male with a hx of Morbid Obesity with HTN, history of COVID-19, Former tobacco (one year off) who presents with chest pain.  In interim of this visit, patient negative CAC. Seen 09/12/20  Patient notes that he is doing ***.  Since day prior/last visit notes *** changes.  Relevant interval testing or therapy include ***.  There are no*** interval hospital/ED visit.    No chest pain or pressure ***.  No SOB/DOE*** and no PND/Orthopnea***.  No weight gain or leg swelling***.  No palpitations or syncope ***.  Ambulatory blood pressure ***.   Past Medical History:  Diagnosis Date   Acute bilateral low back pain without sciatica    COVID-19    GERD (gastroesophageal reflux disease)    SOB (shortness of breath)     Past Surgical History:  Procedure Laterality Date   BIOPSY  02/29/2020   Procedure: BIOPSY;  Surgeon: Dolores Frame, MD;  Location: [facility];  Service: Gastroenterology;;   COLONOSCOPY N/A 06/10/2019   Procedure: COLONOSCOPY;  Surgeon: Malissa Hippo, MD;  Location: [facility];  Service: Endoscopy;  Laterality: N/A;  955   ESOPHAGEAL DILATION N/A 02/29/2020   Procedure: ESOPHAGEAL DILATION;  Surgeon: Dolores Frame, MD;  Location: [facility];  Service: Gastroenterology;  Laterality: N/A;   ESOPHAGOGASTRODUODENOSCOPY (EGD) WITH PROPOFOL N/A 02/29/2020   Procedure: ESOPHAGOGASTRODUODENOSCOPY (EGD) WITH PROPOFOL;  Surgeon: Dolores Frame, MD;  Location: [facility];  Service: Gastroenterology;  Laterality: N/A;  11:30    SHOULDER SURGERY Left     Current Medications: No outpatient medications have been marked as taking for the 09/13/20 encounter (Appointment) with Christell Constant, MD.     Allergies:   Amoxicillin, Elemental sulfur, and Allegra [fexofenadine]   Social History   Socioeconomic History   Marital status: Married    Spouse name: Not on file   Number of children: Not on file   Years of education: Not on file   Highest education level: Not on file  Occupational History   Not on file  Tobacco Use   Smoking status: Never   Smokeless tobacco: Former    Types: Chew    Quit date: 02/17/2019  Vaping Use   Vaping Use: Never used  Substance and Sexual Activity   Alcohol use: No   Drug use: No   Sexual activity: Not on file  Other Topics Concern   Not on file  Social History Narrative   Not on file   Social Determinants of Health   Financial Resource Strain: Not on file  Food Insecurity: Not on file  Transportation Needs: Not on file  Physical Activity: Not on file  Stress: Not on file  Social Connections: Not on file     Family History: The patient's family history includes Barrett's esophagus in his mother; Bladder Cancer in his mother; COPD in his mother; Diabetes in his father; GER disease in his father; Hypertension in his mother. History of coronary artery disease notable for four uncles. History of heart failure notable  for one uncle, brother had enlarge heart valve. History of PFO in uncle History of arrhythmia notable for no members.  ROS:   Please see the history of present illness.     All other systems reviewed and are negative.  EKGs/Labs/Other Studies Reviewed:    The following studies were reviewed today:  EKG:   03/29/20: NSR 63 rate WNL 11/05/19: NSR 60 WNL  Transthoracic Echocardiogram: Date: 11/05/19 Results: [facility] Left Ventricle Normal left ventricular size. Wall thickness is normal. EF: 60-65%. Wall motion is normal. Doppler  parameters indicate normal diastolic function.  Right Ventricle Right ventricle appears normal. Systolic function is normal.  Left Atrium Left atrium is normal in size.  Right Atrium Normal sized right atrium.  Mitral Valve Mitral valve structure is normal. There is mild annular calcification. There is no mitral regurgitation.  Tricuspid Valve Tricuspid valve structure is normal. There is trace regurgitation. The right ventricular systolic pressure is normal (<36 mmHg).  Aortic Valve The aortic valve is tricuspid. The leaflets are not thickened and exhibit normal excursion. There is mild sclerosis. There is no regurgitation or stenosis.  Pulmonic Valve Trace regurgitation. No pulmonic stenosis present.  Ascending Aorta The aortic root is normal in size.  Pericardium There is no pericardial effusion.   Stress Echo [facility]: Date: 01/14/2020 Results: CPACS - 01/14/2020 11:19 AM EDT   Left Ventricle: EF: 60-65%.   Stress: Total exercise time was 6 minutes and 59 seconds. The patient  achieved and a maximal blood pressure of 172/94.   Post-stress: The left ventricle systolic function is hyperdynamic  post-stress.   Post-stress: The post-stress echo showed normal wall motion.   Post-stress Impression: The study is normal.   Post-stress Impression: This study shows a low prognostic risk.   Post-stress Impression: The ECG and Echo portions of the stress study  are concordant with no evidence of inducible myocardial ischemia.  CAC CT: Date:04/07/20 Results: 1. Coronary calcium score of 0. This was 1st percentile for age, gender, and race matched controls.   Recent Labs: 01/27/2020: ALT 41; BUN 16; Creat 0.72; Hemoglobin 16.1; Platelets 367; Potassium 4.4; Sodium 141  Recent Lipid Panel No results found for: CHOL, TRIG, HDL, CHOLHDL, VLDL, LDLCALC, LDLDIRECT   Risk Assessment/Calculations:     N/A  Physical Exam:    VS:  There were no vitals taken for this  visit.    Wt Readings from Last 3 Encounters:  03/29/20 111 kg  02/29/20 107.5 kg  01/27/20 111 kg    GEN: Obese male, well developed in no acute distress HEENT: Normal NECK: No JVD; No carotid bruits *** LYMPHATICS: No lymphadenopathy CARDIAC: RRR, no murmurs, rubs, gallops RESPIRATORY:  Clear to auscultation without rales, wheezing or rhonchi  ABDOMEN: Soft, non-tender, non-distended MUSCULOSKELETAL:  No edema; No deformity  SKIN: Warm and dry NEUROLOGIC:  Alert and oriented x 3 PSYCHIATRIC:  Normal affect   ASSESSMENT:    No diagnosis found.  PLAN:    In order of problems listed above:  Morbid Obesity with HTN Chest Pain  PRN follow up unless new symptoms or abnormal test results warranting change in plan  Would be reasonable for *** Video Visit Follow up  Would be reasonable for *** APP Follow up        Medication Adjustments/Labs and Tests Ordered: Current medicines are reviewed at length with the patient today.  Concerns regarding medicines are outlined above.  No orders of the defined types were placed in this encounter.  No  orders of the defined types were placed in this encounter.   There are no Patient Instructions on file for this visit.   Signed, Christell Constant, MD  09/12/2020 8:26 AM    [facility]

## 2020-09-13 ENCOUNTER — Ambulatory Visit: Payer: BC Managed Care – PPO | Admitting: Internal Medicine

## 2022-03-29 IMAGING — CT CT CARDIAC CORONARY ARTERY CALCIUM SCORE
3 series · 14 of 20 positions shown, 15 images · non-contrast
Comparison: None.
COMPARISON: None.

Addendum:
EXAM:
OVER-READ INTERPRETATION  CT CHEST

The following report is an over-read performed by radiologist Dr.
Dima-Inga Mamavca [REDACTED] on 04/07/2020. This
over-read does not include interpretation of cardiac or coronary
anatomy or pathology. The coronary calcium score interpretation by
the cardiologist is attached.
CLINICAL DATA: Risk stratification: 29 Year-old White Male
Coronary Calcium Score
TECHNIQUE: The patient was scanned on a Siemens Force scanner. Axial
non-contrast 3 mm slices were carried out through the heart. The
data set was analyzed on a dedicated work station and scored using
the Agatson method.

[Series 2: casc 3.0 bv41 2 bestdiast 67 % · axial · 0.42mm/px · z∈[-259,-187]mm · 4 of 41 slices shown, 5 images]
[im 9/41  vessel]
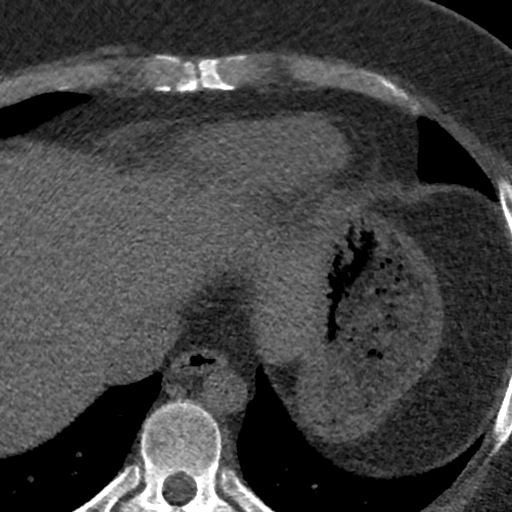
[im 9/41  lung]
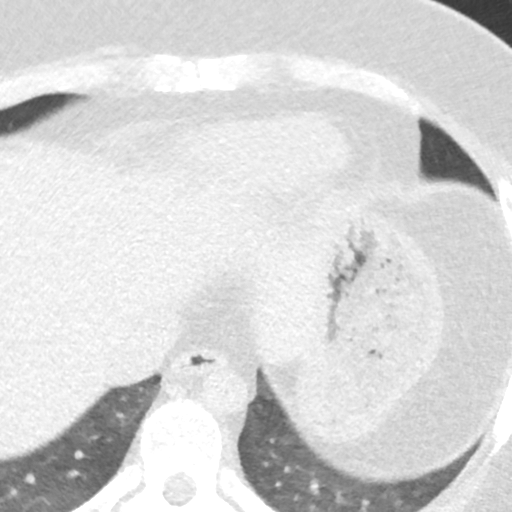
[im 17/41  vessel]
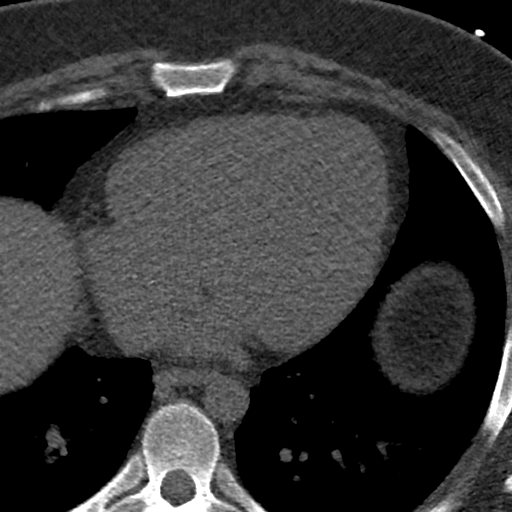
[im 25/41  vessel]
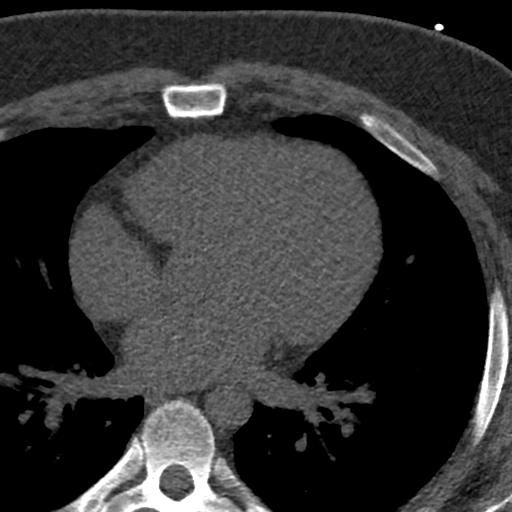
[im 33/41  vessel]
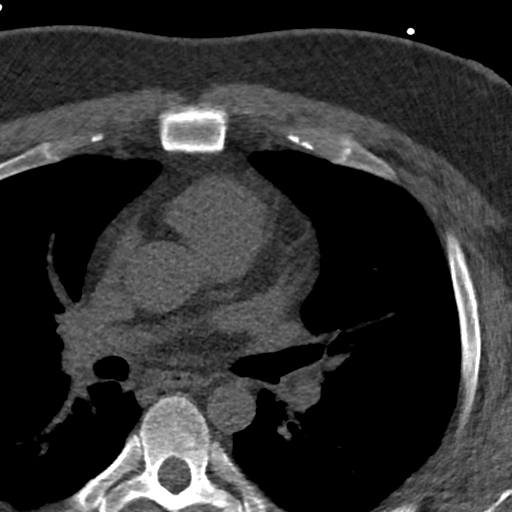

[Series 3: lung 64 % · axial · 0.77mm/px · z∈[-265,-184]mm · 5 of 41 slices shown]
[im 7/41  lung]
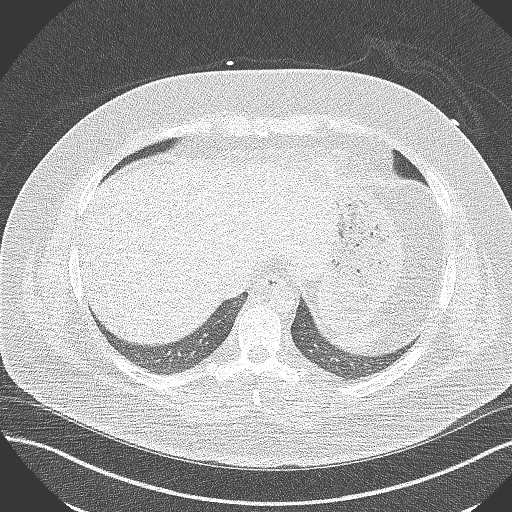
[im 14/41  lung]
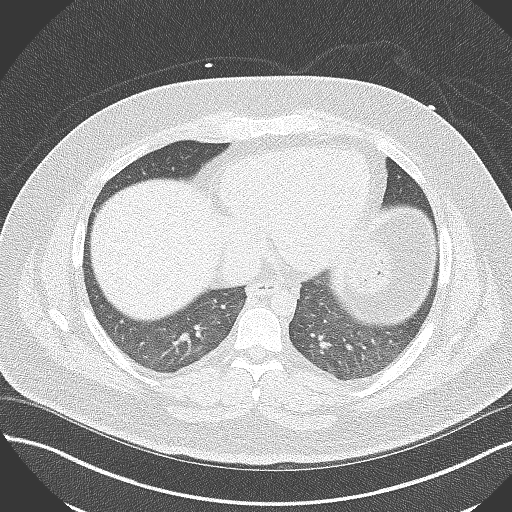
[im 21/41  lung]
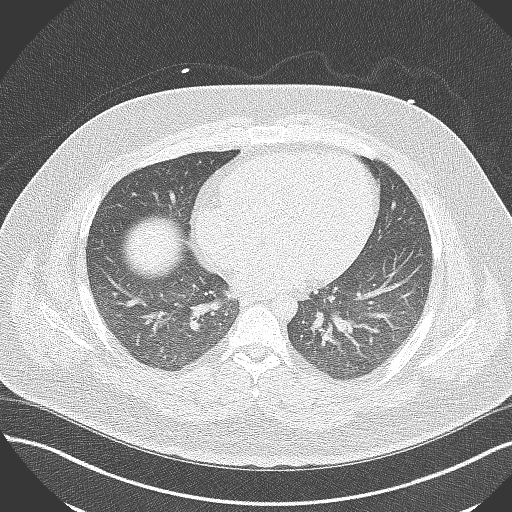
[im 27/41  lung]
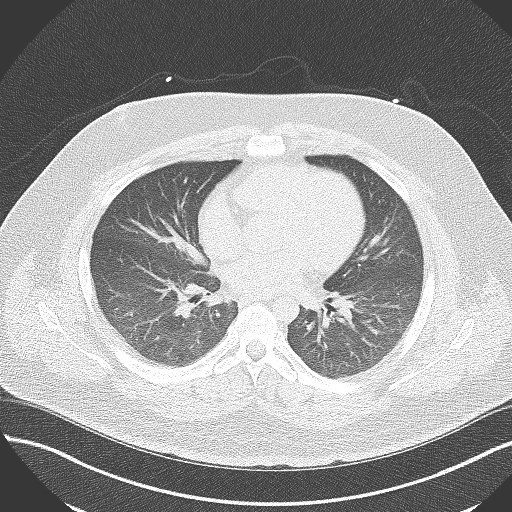
[im 34/41  lung]
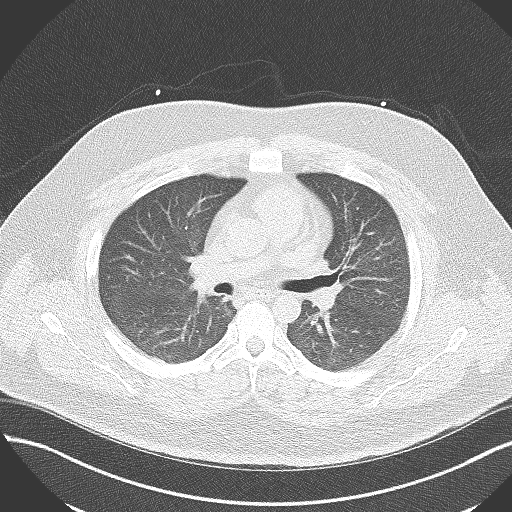

[Series 4: lung st 64 % · axial · 0.77mm/px · z∈[-265,-184]mm · 5 of 41 slices shown]
[im 7/41  lung]
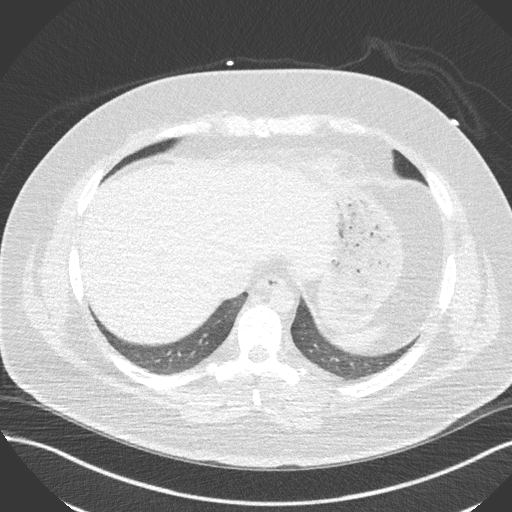
[im 14/41  lung]
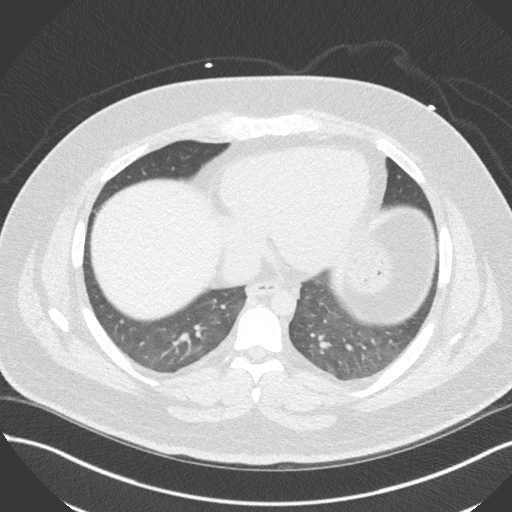
[im 21/41  lung]
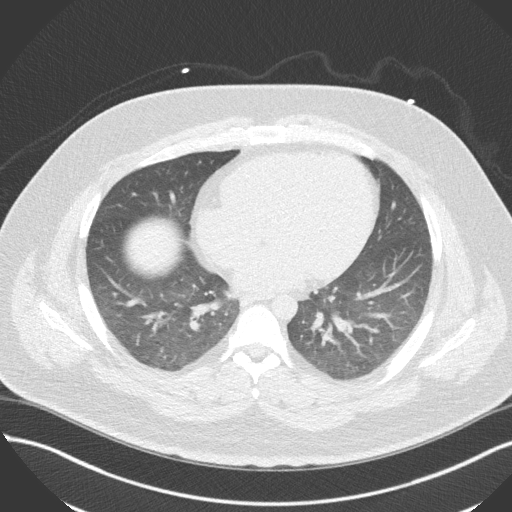
[im 27/41  lung]
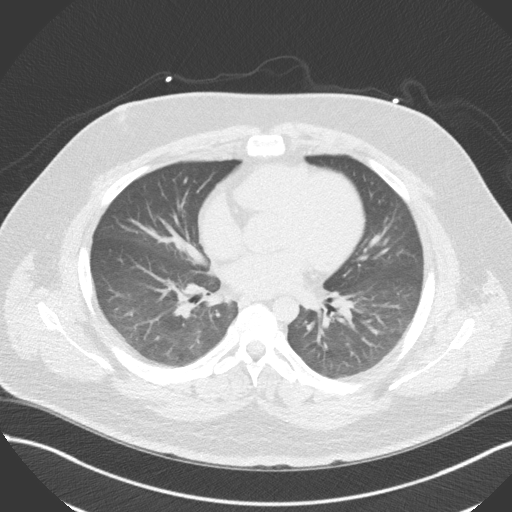
[im 34/41  lung]
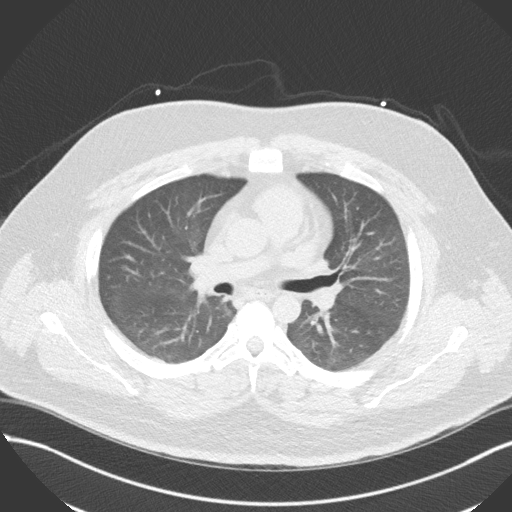

[14 of 20 positions shown; findings below may reference images not displayed]

FINDINGS: Vascular: No significant vascular findings. Normal heart size. No
pericardial effusion.

Mediastinum/Nodes: Visualized mediastinum and hilar regions
demonstrate no lymphadenopathy or masses.

Lungs/Pleura: Visualized lungs show no evidence of pulmonary edema,
consolidation, pneumothorax, nodule or pleural fluid.

Upper Abdomen: No acute abnormality.

Musculoskeletal: No chest wall mass or suspicious bone lesions
identified.
IMPRESSION: No significant incidental findings.
FINDINGS: Non-cardiac: See separate report from [REDACTED].

Ascending Aorta: Normal caliber. Study does not show ascending
aortic arch and therefor cannot rule out aortic atherosclerosis.

Pericardium: Normal.

Coronary arteries: Normal origins.

Coronary calcium score of 0. This was 1st percentile for age,
gender, and race matched controls.
IMPRESSION: 1. Coronary calcium score of 0. This was 1st percentile for age,
gender, and race matched controls.

*** End of Addendum ***
EXAM:
OVER-READ INTERPRETATION  CT CHEST

The following report is an over-read performed by radiologist Dr.
Dima-Inga Mamavca [REDACTED] on 04/07/2020. This
over-read does not include interpretation of cardiac or coronary
anatomy or pathology. The coronary calcium score interpretation by
the cardiologist is attached.
FINDINGS: Vascular: No significant vascular findings. Normal heart size. No
pericardial effusion.

Mediastinum/Nodes: Visualized mediastinum and hilar regions
demonstrate no lymphadenopathy or masses.

Lungs/Pleura: Visualized lungs show no evidence of pulmonary edema,
consolidation, pneumothorax, nodule or pleural fluid.

Upper Abdomen: No acute abnormality.

Musculoskeletal: No chest wall mass or suspicious bone lesions
identified.
IMPRESSION: No significant incidental findings.
